# Patient Record
Sex: Female | Born: 1960 | Race: White | Hispanic: No | Smoking: Never smoker
Health system: Southern US, Community
[De-identification: ages and names within clinical notes are randomized; demographics above are authoritative.]

## PROBLEM LIST (undated history)

## (undated) DIAGNOSIS — A6920 Lyme disease, unspecified: Secondary | ICD-10-CM

## (undated) DIAGNOSIS — M797 Fibromyalgia: Secondary | ICD-10-CM

## (undated) DIAGNOSIS — K828 Other specified diseases of gallbladder: Secondary | ICD-10-CM

## (undated) HISTORY — PX: OVARIAN CYST SURGERY: SHX726

## (undated) HISTORY — PX: ECTOPIC PREGNANCY SURGERY: SHX613

---

## 2011-07-11 ENCOUNTER — Emergency Department: Payer: Self-pay | Admitting: *Deleted

## 2016-07-18 DIAGNOSIS — M7918 Myalgia, other site: Secondary | ICD-10-CM | POA: Insufficient documentation

## 2016-07-18 DIAGNOSIS — M542 Cervicalgia: Secondary | ICD-10-CM | POA: Insufficient documentation

## 2017-01-30 ENCOUNTER — Ambulatory Visit
Admission: EM | Admit: 2017-01-30 | Discharge: 2017-01-30 | Disposition: A | Discharge status: Home / Self Care | Payer: BLUE CROSS/BLUE SHIELD | Attending: Family Medicine | Admitting: Family Medicine

## 2017-01-30 ENCOUNTER — Encounter: Payer: Self-pay | Admitting: *Deleted

## 2017-01-30 DIAGNOSIS — S63501A Unspecified sprain of right wrist, initial encounter: Secondary | ICD-10-CM | POA: Diagnosis not present

## 2017-01-30 DIAGNOSIS — M79601 Pain in right arm: Secondary | ICD-10-CM

## 2017-01-30 DIAGNOSIS — M25531 Pain in right wrist: Secondary | ICD-10-CM | POA: Diagnosis not present

## 2017-01-30 HISTORY — DX: Fibromyalgia: M79.7

## 2017-01-30 HISTORY — DX: Lyme disease, unspecified: A69.20

## 2017-01-30 MED ORDER — PREDNISONE 20 MG PO TABS: ORAL_TABLET | ORAL | 0 refills | Status: DC

## 2017-01-30 NOTE — ED Provider Notes (Signed)
[facility]    CSN: [id number] Arrival date & time: 01/30/17  1309     History   Chief Complaint Chief Complaint  Patient presents with  . Arm Pain  . Wrist Pain    HPI Belinda Hawkins is a 56 y.o. female.   56 yo female with approximately 1 week h/o right wrist pain. 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    The history is provided by the patient.  Arm Pain   Wrist Pain     Past Medical History:  Diagnosis Date  . Fibromyalgia   . Lyme disease     There are no active problems to display for this patient.   Past Surgical History:  Procedure Laterality Date  . CESAREAN SECTION      OB History    No data available       Home Medications    Prior to Admission medications   Medication Sig Start Date End Date Taking? Authorizing Provider  buprenorphine (BUTRANS) 20 MCG/HR PTWK patch Place 20 mcg onto the skin once a week.   Yes Historical Provider, MD  oxyCODONE (OXYCONTIN) 15 mg 12 hr tablet Take 15 mg by mouth as needed.   Yes Historical Provider, MD  TIZANIDINE HCL PO Take by mouth daily.   Yes Historical Provider, MD  predniSONE (DELTASONE) 20 MG tablet 2 tabs po qd for 5 days 01/30/17   Payton Mccallum, MD    Family History History reviewed. No pertinent family history.  Social History Social History  Substance Use Topics  . Smoking status: Never Smoker  . Smokeless tobacco: Never Used  . Alcohol use No     Allergies   Nsaids   Review of Systems Review of Systems   Physical Exam Triage Vital Signs ED Triage Vitals  Enc Vitals Group     BP 01/30/17 1427 120/67     Pulse Rate 01/30/17 1427 82     Resp 01/30/17 1427 16     Temp 01/30/17 1427 98.3 F (36.8 C)     Temp Source 01/30/17 1427 Oral     SpO2 01/30/17 1427 100 %     Weight 01/30/17 1429 142 lb (64.4 kg)     Height 01/30/17 1429 5\' 2"  (1.575 m)     Head Circumference --      Peak Flow --      Pain Score  01/30/17 1434 5     Pain Loc --      Pain Edu? --      Excl. in GC? --    No data found.   Updated Vital Signs BP 120/67 (BP Location: Left Arm)   Pulse 82   Temp 98.3 F (36.8 C) (Oral)   Resp 16   Ht 5\' 2"  (1.575 m)   Wt 142 lb (64.4 kg)   SpO2 100%   BMI 25.97 kg/m   Visual Acuity Right Eye Distance:   Left Eye Distance:   Bilateral Distance:    Right Eye Near:   Left Eye Near:    Bilateral Near:     Physical Exam  Constitutional: She appears well-developed and well-nourished. No distress.  Musculoskeletal:       Right wrist: She exhibits tenderness (over the dorsal soft tissues). She exhibits normal range of motion, no bony tenderness, no swelling, no effusion, no crepitus, no deformity and no laceration.  Skin: She is not diaphoretic.  Nursing note and vitals reviewed.  UC Treatments / Results  Labs (all labs ordered are listed, but only abnormal results are displayed) Labs Reviewed - No data to display  EKG  EKG Interpretation None       Radiology No results found.  Procedures Procedures (including critical care time)  Medications Ordered in UC Medications - No data to display   Initial Impression / Assessment and Plan / UC Course  I have reviewed the triage vital signs and the nursing notes.  Pertinent labs & imaging results that were available during my care of the patient were reviewed by me and considered in my medical decision making (see chart for details).       Final Clinical Impressions(s) / UC Diagnoses   Final diagnoses:  Sprain of right wrist, initial encounter    New Prescriptions Discharge Medication List as of 01/30/2017  3:05 PM    START taking these medications   Details  predniSONE (DELTASONE) 20 MG tablet 2 tabs po qd for 5 days, Normal       1. diagnosis reviewed with patient 2. rx as per orders above; reviewed possible side effects, interactions, risks and benefits  3. Recommend supportive treatment with  rest, ice, otc analgesics 4. Follow-up prn if symptoms worsen or don't improve   Payton Mccallumrlando Lincoln Kleiner, MD 01/30/17 [phone]

## 2017-01-30 NOTE — ED Triage Notes (Signed)
Patient started having right wrist and arm pain 1 week ago.

## 2017-01-30 NOTE — Discharge Instructions (Signed)
Rest, ice

## 2017-02-11 ENCOUNTER — Ambulatory Visit
Admission: EM | Admit: 2017-02-11 | Discharge: 2017-02-11 | Disposition: A | Discharge status: Home / Self Care | Payer: BLUE CROSS/BLUE SHIELD | Attending: Family Medicine | Admitting: Family Medicine

## 2017-02-11 DIAGNOSIS — J204 Acute bronchitis due to parainfluenza virus: Secondary | ICD-10-CM

## 2017-02-11 MED ORDER — HYDROCOD POLST-CPM POLST ER 10-8 MG/5ML PO SUER: 5.0000 mL | Freq: Two times a day (BID) | ORAL | 0 refills | Status: DC | PRN

## 2017-02-11 MED ORDER — AZITHROMYCIN 250 MG PO TABS: ORAL_TABLET | ORAL | 0 refills | Status: DC

## 2017-02-11 NOTE — ED Provider Notes (Signed)
[facility]    CSN: [id number] Arrival date & time: 02/11/17  1656     History   Chief Complaint Chief Complaint  Patient presents with  . Cough    HPI Belinda Hawkins is a 56 y.o. female.   Patient is a 56 year old white female who's had a cough and congestion. She states that she had the flu last Wednesday. At that time she was doing some coughing but has sore throat aching all over fever and other symptoms of flu. As of the symptoms of the flu have improved by Friday the cough started getting worse and has progressed. Swing back to work today and is coughing as she states her head off difficult to work with coughing and chest congestion. Longer having a fever. She has informed me that she needs also a note for work saying that she was seen by Dr. Andrey Cota that she was too sick to come in last week when she had the flu because she called and there was a two hour wait to be seen. She denies smoking. She's had a C-section. She has a history of fibromyalgia and Lyme disease. Ceftin past family medical history she is allergic to NSAIDs as well.   The history is provided by the patient. No language interpreter was used.  Cough  Cough characteristics:  Productive and non-productive Sputum characteristics:  Yellow, white and green Severity:  Moderate Timing:  Constant Progression:  Worsening Chronicity:  New Smoker: no   Context: upper respiratory infection   Relieved by:  Nothing Worsened by:  Nothing Associated symptoms: shortness of breath and wheezing   Risk factors: recent infection     Past Medical History:  Diagnosis Date  . Fibromyalgia   . Lyme disease     There are no active problems to display for this patient.   Past Surgical History:  Procedure Laterality Date  . CESAREAN SECTION      OB History    No data available       Home Medications    Prior to Admission medications   Medication Sig Start Date End Date Taking? Authorizing  Provider  azithromycin (ZITHROMAX Z-PAK) 250 MG tablet Take 2 tablets first day and then 1 po a day for 4 days 02/11/17   Hassan Rowan, MD  buprenorphine (BUTRANS) 20 MCG/HR PTWK patch Place 20 mcg onto the skin once a week.    Historical Provider, MD  chlorpheniramine-HYDROcodone (TUSSIONEX PENNKINETIC ER) 10-8 MG/5ML SUER Take 5 mLs by mouth every 12 (twelve) hours as needed. 02/11/17   Hassan Rowan, MD  oxyCODONE (OXYCONTIN) 15 mg 12 hr tablet Take 15 mg by mouth as needed.    Historical Provider, MD  predniSONE (DELTASONE) 20 MG tablet 2 tabs po qd for 5 days 01/30/17   Payton Mccallum, MD  TIZANIDINE HCL PO Take by mouth daily.    Historical Provider, MD    Family History History reviewed. No pertinent family history.  Social History Social History  Substance Use Topics  . Smoking status: Never Smoker  . Smokeless tobacco: Never Used  . Alcohol use No     Allergies   Nsaids   Review of Systems Review of Systems  HENT: Positive for congestion.   Respiratory: Positive for cough, shortness of breath and wheezing.   All other systems reviewed and are negative.    Physical Exam Triage Vital Signs ED Triage Vitals  Enc Vitals Group     BP 02/11/17  1806 (!) 114/92     Pulse Rate 02/11/17 1806 71     Resp 02/11/17 1806 18     Temp 02/11/17 1806 98.2 F (36.8 C)     Temp Source 02/11/17 1806 Oral     SpO2 02/11/17 1806 100 %     Weight 02/11/17 1807 142 lb (64.4 kg)     Height 02/11/17 1807  (1.575 m)     Head Circumference --      Peak Flow --      Pain Score 02/11/17 1807 0     Pain Loc --      Pain Edu? --      Excl. in GC? --    No data found.   Updated Vital Signs BP (!) 114/92 (BP Location: Left Arm)   Pulse 71   Temp 98.2 F (36.8 C) (Oral)   Resp 18   Ht  (1.575 m)   Wt 142 lb (64.4 kg)   SpO2 100%   BMI 25.97 kg/m   Visual Acuity Right Eye Distance:   Left Eye Distance:   Bilateral Distance:    Right Eye Near:   Left Eye Near:      Bilateral Near:     Physical Exam  Constitutional: She is oriented to person, place, and time. She appears well-developed and well-nourished.  HENT:  Head: Normocephalic.  Right Ear: External ear normal.  Left Ear: External ear normal.  Mouth/Throat: Oropharynx is clear and moist.  Eyes: Conjunctivae and EOM are normal. Pupils are equal, round, and reactive to light.  Neck: Normal range of motion.  Cardiovascular: Normal rate, regular rhythm and normal heart sounds.   Pulmonary/Chest: Effort normal. She has rhonchi.  Patient is actively coughing during examination.  Musculoskeletal: Normal range of motion. She exhibits no edema or deformity.  Neurological: She is alert and oriented to person, place, and time. No cranial nerve deficit.  Skin: Skin is warm.  Psychiatric: She has a normal mood and affect.  Vitals reviewed.    UC Treatments / Results  Labs (all labs ordered are listed, but only abnormal results are displayed) Labs Reviewed - No data to display  EKG  EKG Interpretation None       Radiology No results found.  Procedures Procedures (including critical care time)  Medications Ordered in UC Medications - No data to display   Initial Impression / Assessment and Plan / UC Course  I have reviewed the triage vital signs and the nursing notes.  Pertinent labs & imaging results that were available during my care of the patient were reviewed by me and considered in my medical decision making (see chart for details).   will place patient on Tussionex 1 teaspoon twice a day Zithromax Z-PAK gave patient a work note saying she was seen today. She wants return to work tomorrow will put the date for tomorrow return. Follow-up PCP if not better in 1-2 weeks.   Final Clinical Impressions(s) / UC Diagnoses   Final diagnoses:  Acute bronchitis due to parainfluenza virus    New Prescriptions New Prescriptions   AZITHROMYCIN (ZITHROMAX Z-PAK) 250 MG TABLET    Take 2  tablets first day and then 1 po a day for 4 days   CHLORPHENIRAMINE-HYDROCODONE (TUSSIONEX PENNKINETIC ER) 10-8 MG/5ML SUER    Take 5 mLs by mouth every 12 (twelve) hours as needed.     Note: This dictation was prepared with Dragon dictation along with smaller phrase technology. Any transcriptional  errors that result from this process are unintentional.   Hassan Rowan, MD 02/11/17 [id number]

## 2017-02-11 NOTE — ED Triage Notes (Signed)
Pt c/o a cough that she has had since getting over the flu since last Wednesday. She says she needs a doctors note and something for a cough.

## 2020-01-23 ENCOUNTER — Encounter: Payer: Self-pay | Admitting: Emergency Medicine

## 2020-01-23 ENCOUNTER — Ambulatory Visit
Admission: EM | Admit: 2020-01-23 | Discharge: 2020-01-23 | Disposition: A | Discharge status: Home / Self Care | Payer: BC Managed Care – PPO | Attending: Family Medicine | Admitting: Family Medicine

## 2020-01-23 ENCOUNTER — Other Ambulatory Visit: Payer: Self-pay

## 2020-01-23 DIAGNOSIS — S50812A Abrasion of left forearm, initial encounter: Secondary | ICD-10-CM

## 2020-01-23 DIAGNOSIS — R6889 Other general symptoms and signs: Secondary | ICD-10-CM | POA: Diagnosis not present

## 2020-01-23 MED ORDER — AMOXICILLIN-POT CLAVULANATE 875-125 MG PO TABS: 1.0000 | ORAL_TABLET | Freq: Two times a day (BID) | ORAL | 0 refills | Status: AC

## 2020-01-23 MED ORDER — FLUOCINOLONE ACETONIDE 0.01 % EX CREA: TOPICAL_CREAM | Freq: Two times a day (BID) | CUTANEOUS | 0 refills | Status: DC | PRN

## 2020-01-23 MED ORDER — FLUCONAZOLE 150 MG PO TABS: ORAL_TABLET | ORAL | 0 refills | Status: DC

## 2020-01-23 NOTE — Discharge Instructions (Addendum)
Recommend start Augmentin 875mg  twice a day as directed. May take Diflucan 150mg  one tablet 2 days after starting antibiotic, may repeat another tablet at end of antibiotic use. Continue to clean area with soap and water. Apply topical antibiotic ointment as needed and cover with bandage. May use Fluocinolone cream twice a day around area to help with itching. Follow-up in 3 to 4 days if not improving.

## 2020-01-23 NOTE — ED Triage Notes (Signed)
Patient states that she scraped her arm on something this morning.  Patient states that her left forearm had started itching prior to the injury and states that she has been scratching at it.  Patient also states that she has noticed some ulcers or blisters at the site.  Patient denies fevers. Patient denies any drainage.

## 2020-01-24 NOTE — ED Provider Notes (Signed)
[facility]    CSN: [id number] Arrival date & time: 01/23/20  1410      History   Chief Complaint Chief Complaint  Patient presents with   Arm Pain    left forearm    HPI Belinda Hawkins is a 59 y.o. female.   59 year old female presents with irritation, scratches and small abrasion to her left forearm. She first felt some itching of her left lower arm about 2 to 3 days ago. Yesterday she noticed more scratches on her arm and is uncertain if her cats also scratched her in that area. This morning she scraped her arm against something this morning and noticed more ulcer-like abrasions that have been bleeding and oozing. Slightly tender but denies any fever, radiation of pain, numbness or warmth. She put antibiotic ointment on the areas and covered with bandage. She is concerned about MRSA infection. Other chronic health issues include chronic pain/fibromyalgia and currently on Oxycontin, Butrans patch and Tizanidine at night to sleep.   The history is provided by the patient.    Past Medical History:  Diagnosis Date   Fibromyalgia    Lyme disease     There are no problems to display for this patient.   Past Surgical History:  Procedure Laterality Date   CESAREAN SECTION      OB History   No obstetric history on file.      Home Medications    Prior to Admission medications   Medication Sig Start Date End Date Taking? Authorizing Provider  buprenorphine (BUTRANS) 20 MCG/HR PTWK patch Place 20 mcg onto the skin once a week.   Yes [provider]  oxyCODONE (OXYCONTIN) 15 mg 12 hr tablet Take 15 mg by mouth as needed.   Yes [provider]  TIZANIDINE HCL PO Take by mouth daily.   Yes [provider]  amoxicillin-clavulanate (AUGMENTIN) 875-125 MG tablet Take 1 tablet by mouth every 12 (twelve) hours for 7 days. 01/23/20 01/30/20  Sudie Grumbling, NP  fluconazole (DIFLUCAN) 150 MG tablet Take 1 tablet by mouth if symptoms of  antibiotic induced yeast infection occur. May repeat 1 tablet in 3 days if needed. 01/23/20   Sudie Grumbling, NP  fluocinolone (VANOS) 0.01 % cream Apply topically 2 (two) times daily as needed. To affected areas. 01/23/20   AmyotAli Lowe, NP    Family History Family History  Problem Relation Age of Onset   Healthy Mother    Melanoma Father     Social History Social History   Tobacco Use   Smoking status: Never Smoker   Smokeless tobacco: Never Used  Substance Use Topics   Alcohol use: No   Drug use: No     Allergies   Nsaids   Review of Systems Review of Systems  Constitutional: Negative for activity change, appetite change, chills, fatigue and fever.  Respiratory: Negative for chest tightness and shortness of breath.   Musculoskeletal: Positive for arthralgias and myalgias.  Skin: Positive for rash and wound. Negative for color change.  Allergic/Immunologic: Negative for environmental allergies and immunocompromised state.  Neurological: Negative for dizziness, seizures, syncope, weakness, light-headedness, numbness and headaches.  Hematological: Negative for adenopathy. Does not bruise/bleed easily.  Psychiatric/Behavioral: Positive for sleep disturbance.     Physical Exam Triage Vital Signs ED Triage Vitals  Enc Vitals Group     BP 01/23/20 1429 (!) 147/89     Pulse Rate 01/23/20 1429 77     Resp 01/23/20  1429 14     Temp 01/23/20 1429 98.5 F (36.9 C)     Temp Source 01/23/20 1429 Oral     SpO2 01/23/20 1429 97 %     Weight 01/23/20 1427 138 lb (62.6 kg)     Height 01/23/20 1427 5\' 2"  (1.575 m)     Head Circumference --      Peak Flow --      Pain Score 01/23/20 1427 2     Pain Loc --      Pain Edu? --      Excl. in [place]? --    No data found.  Updated Vital Signs BP (!) 147/89 (BP Location: Right Arm)    Pulse 77    Temp 98.5 F (36.9 C) (Oral)    Resp 14    Ht 5\' 2"  (1.575 m)    Wt 138 lb (62.6 kg)    SpO2 97%    BMI 25.24 kg/m   Visual  Acuity Right Eye Distance:   Left Eye Distance:   Bilateral Distance:    Right Eye Near:   Left Eye Near:    Bilateral Near:     Physical Exam Vitals and nursing note reviewed.  Constitutional:      General: She is awake. She is not in acute distress.    Appearance: She is well-developed and well-groomed.     Comments: She is sitting comfortably in exam chair in no acute distress but appears nervous.   HENT:     Head: Normocephalic and atraumatic.  Eyes:     Extraocular Movements: Extraocular movements intact.     Conjunctiva/sclera: Conjunctivae normal.  Cardiovascular:     Rate and Rhythm: Normal rate.  Pulmonary:     Effort: Pulmonary effort is normal.  Musculoskeletal:        General: Normal range of motion.     Right forearm: Normal.     Left forearm: Tenderness (slight) present. No swelling or deformity.       Arms:     Comments: 2-3 areas of straight line raised excoriations on her left outer forearm, closer to her elbow. 2 pencil eraser-sized thin abrasions present on lateral aspect of excoriations. Slightly oozing clear to bloody fluid. Slightly tender over area. No surrounding erythema. No signs of cellulitis. A few more straight-line lesions present on left upper outer arm. No numbness. Good distal pulses and capillary refill.   Skin:    General: Skin is warm.     Capillary Refill: Capillary refill takes less than 2 seconds.     Findings: Abrasion, rash and wound present. No bruising, ecchymosis or petechiae. Rash is papular.  Neurological:     General: No focal deficit present.     Mental Status: She is alert and oriented to person, place, and time.     Sensory: Sensation is intact. No sensory deficit.     Motor: Motor function is intact.  Psychiatric:        Attention and Perception: Attention normal.        Mood and Affect: Affect normal. Mood is anxious.        Speech: Speech normal.        Behavior: Behavior normal. Behavior is cooperative.        Thought  Content: Thought content normal.        Judgment: Judgment normal.      UC Treatments / Results  Labs (all labs ordered are listed, but only abnormal results are displayed) Labs  Reviewed - No data to display  EKG   Radiology No results found.  Procedures Procedures (including critical care time)  Medications Ordered in UC Medications - No data to display  Initial Impression / Assessment and Plan / UC Course  I have reviewed the triage vital signs and the nursing notes.  Pertinent labs & imaging results that were available during my care of the patient were reviewed by me and considered in my medical decision making (see chart for details).     Patient appeared nervous about wounds and rash and concern over MRSA. Discussed that she has mild excoriations/scratches with no indication of cellulitis at this time. Discussed that MRSA usually appears more like a boil with purulent discharge which is not her clinical presentation. Cleaned area with wound cleanser, applied Bacitracin ointment and covered with gauze and coban. Reviewed that since she is uncertain about whether a cat has scratched her, will treat with antibiotics that cover animal, particularly cat, scratches and potential infections. Start Augmentin 875mg  twice a day as directed. Since patient has difficulty with oral antibiotics with GI upset and change in normal flora, will prescribed Diflucan 150mg  to take on day 2 of antibiotic use. May repeat another tablet at end of antibiotics. Continue to clean area with soap and water and try to avoid additional scratching if possible. Apply topical antibiotic ointment as needed and cover with a bandage. May also use Fluocinolone cream twice a day on itchy areas of skin on upper and lower arms but avoid any open areas. Follow-up in 3 to 4 days if not improving.   Final Clinical Impressions(s) / UC Diagnoses   Final diagnoses:  Excoriation of forearm, left, initial encounter    Suspected cat scratch     Discharge Instructions     Recommend start Augmentin 875mg  twice a day as directed. May take Diflucan 150mg  one tablet 2 days after starting antibiotic, may repeat another tablet at end of antibiotic use. Continue to clean area with soap and water. Apply topical antibiotic ointment as needed and cover with bandage. May use Fluocinolone cream twice a day around area to help with itching. Follow-up in 3 to 4 days if not improving.     ED Prescriptions    Medication Sig Dispense Auth. Provider   amoxicillin-clavulanate (AUGMENTIN) 875-125 MG tablet Take 1 tablet by mouth every 12 (twelve) hours for 7 days. 14 tablet , NP   fluconazole (DIFLUCAN) 150 MG tablet Take 1 tablet by mouth if symptoms of antibiotic induced yeast infection occur. May repeat 1 tablet in 3 days if needed. 2 tablet , NP   fluocinolone (VANOS) 0.01 % cream Apply topically 2 (two) times daily as needed. To affected areas. 15 g , NP     PDMP not reviewed this encounter.   , NP 01/24/20 1127

## 2020-03-08 ENCOUNTER — Encounter: Payer: Self-pay | Admitting: Emergency Medicine

## 2020-03-08 ENCOUNTER — Other Ambulatory Visit: Payer: Self-pay

## 2020-03-08 ENCOUNTER — Ambulatory Visit
Admission: EM | Admit: 2020-03-08 | Discharge: 2020-03-08 | Disposition: A | Discharge status: Home / Self Care | Payer: BC Managed Care – PPO | Attending: Emergency Medicine | Admitting: Emergency Medicine

## 2020-03-08 DIAGNOSIS — L03114 Cellulitis of left upper limb: Secondary | ICD-10-CM

## 2020-03-08 DIAGNOSIS — T63481A Toxic effect of venom of other arthropod, accidental (unintentional), initial encounter: Secondary | ICD-10-CM

## 2020-03-08 MED ORDER — PREDNISONE 10 MG PO TABS: ORAL_TABLET | ORAL | 0 refills | Status: DC

## 2020-03-08 MED ORDER — SULFAMETHOXAZOLE-TRIMETHOPRIM 800-160 MG PO TABS: 1.0000 | ORAL_TABLET | Freq: Two times a day (BID) | ORAL | 0 refills | Status: AC

## 2020-03-08 NOTE — Discharge Instructions (Addendum)
Take medication as prescribed. Rest. Drink plenty of fluids. Elevate. Ice. Over the counter benadryl and pepcid as discussed.   Follow up with your primary care physician this week as needed. Return to Urgent care for new or worsening concerns.

## 2020-03-08 NOTE — ED Triage Notes (Signed)
Patient states she was stung by a bee on her left arm 2 days ago. She has swelling, redness and the area is warm to touch.

## 2020-03-08 NOTE — ED Provider Notes (Signed)
[facility] ____________________________________________  Time seen: Approximately 8:37 PM  I have reviewed the triage vital signs and the nursing notes.   HISTORY  Chief Complaint Insect Bite and Arm Swelling   HPI Belinda Hawkins is a 59 y.o. female presenting for evaluation of left arm redness after bee sting that occurred 2 days ago.  Reports initially she had no pain and minimal redness at the site of bee sting, but reports since it has progressively increase in redness, swelling.  States the area is somewhat tender as well as itchy.  Denies decreased range of motion.  Denies fevers, shortness of breath, difficulty breathing, throat or oral swelling.  Denies history of allergic reaction to bee stings in past.  States she does not receive tetanus immunizations and declines tetanus immunization.  Denies fevers.  Reports otherwise doing well.    Past Medical History:  Diagnosis Date  . Fibromyalgia   . Lyme disease     There are no problems to display for this patient.   Past Surgical History:  Procedure Laterality Date  . CESAREAN SECTION       No current facility-administered medications for this encounter.  Current Outpatient Medications:  .  buprenorphine (BUTRANS) 20 MCG/HR PTWK patch, Place 20 mcg onto the skin once a week., Disp: , Rfl:  .  oxyCODONE (OXYCONTIN) 15 mg 12 hr tablet, Take 15 mg by mouth as needed., Disp: , Rfl:  .  TIZANIDINE HCL PO, Take by mouth daily., Disp: , Rfl:  .  fluconazole (DIFLUCAN) 150 MG tablet, Take 1 tablet by mouth if symptoms of antibiotic induced yeast infection occur. May repeat 1 tablet in 3 days if needed., Disp: 2 tablet, Rfl: 0 .  fluocinolone (VANOS) 0.01 % cream, Apply topically 2 (two) times daily as needed. To affected areas., Disp: 15 g, Rfl: 0 .  predniSONE (DELTASONE) 10 MG tablet, Start 60 mg po day one, then 50 mg po day two, taper by 10 mg daily until complete., Disp: 21 tablet, Rfl: 0 .   sulfamethoxazole-trimethoprim (BACTRIM DS) 800-160 MG tablet, Take 1 tablet by mouth 2 (two) times daily for 7 days., Disp: 14 tablet, Rfl: 0  Allergies Nsaids  Family History  Problem Relation Age of Onset  . Healthy Mother   . Melanoma Father     Social History Social History   Tobacco Use  . Smoking status: Never Smoker  . Smokeless tobacco: Never Used  Substance Use Topics  . Alcohol use: No  . Drug use: No    Review of Systems Constitutional: No fever ENT: Denies difficulty swallowing. Cardiovascular: Denies chest pain. Respiratory: Denies shortness of breath. Gastrointestinal: No abdominal pain. Musculoskeletal: Positive left arm discomfort. Skin: Positive rash.  ____________________________________________   PHYSICAL EXAM:  VITAL SIGNS: ED Triage Vitals  Enc Vitals Group     BP 03/08/20 1934 122/87     Pulse Rate 03/08/20 1934 80     Resp 03/08/20 1934 18     Temp 03/08/20 1934 98.2 F (36.8 C)     Temp Source 03/08/20 1934 Oral     SpO2 03/08/20 1934 99 %     Weight 03/08/20 1932 142 lb (64.4 kg)     Height 03/08/20 1932 5\' 2"  (1.575 m)     Head Circumference --      Peak Flow --      Pain Score 03/08/20 1932 4     Pain Loc --      Pain Edu? --  Excl. in GC? --     Constitutional: Alert and oriented. Well appearing and in no acute distress. Eyes: Conjunctivae are normal.  ENT      Head: Normocephalic and atraumatic.. Cardiovascular: Normal rate, regular rhythm. Grossly normal heart sounds.  Good peripheral circulation. Respiratory: Normal respiratory effort without tachypnea nor retractions. Breath sounds are clear and equal bilaterally. No wheezes, rales, rhonchi. Musculoskeletal: Steady gait. Neurologic:  Normal speech and language. No gross focal neurologic deficits are appreciated. Speech is normal. No gait instability.  Skin:  Skin is warm, dry except: Left medial arm 12 x 16 cm noncircumferential erythema with mild warmth and minimal  tenderness, no drainage, no induration, no fluctuance, full range of motion present, normal distal sensation and cap refill, normal distal radial pulse. Psychiatric: Mood and affect are normal. Speech and behavior are normal. Patient exhibits appropriate insight and judgment   ___________________________________________   LABS (all labs ordered are listed, but only abnormal results are displayed)  Labs Reviewed - No data to display ____________________________________________   PROCEDURES Procedures     INITIAL IMPRESSION / ASSESSMENT AND PLAN / ED COURSE  Pertinent labs & imaging results that were available during my care of the patient were reviewed by me and considered in my medical decision making (see chart for details).  Well-appearing patient.  Left arm local reaction from bee sting, concern for secondary cellulitis.  Will treat with prednisone and Bactrim, over-the-counter Benadryl and Pepcid.  Elevation, ice, rest and supportive care.  Discussed indication, risks and benefits of medications with patient. Discussed follow up and return parameters including no resolution or any worsening concerns. Patient verbalized understanding and agreed to plan.   ____________________________________________   FINAL CLINICAL IMPRESSION(S) / ED DIAGNOSES  Final diagnoses:  Local reaction to insect sting, accidental or unintentional, initial encounter  Left arm cellulitis     ED Discharge Orders         Ordered    predniSONE (DELTASONE) 10 MG tablet     03/08/20 2000    sulfamethoxazole-trimethoprim (BACTRIM DS) 800-160 MG tablet  2 times daily     03/08/20 2000           Note: This dictation was prepared with Dragon dictation along with smaller phrase technology. Any transcriptional errors that result from this process are unintentional.         Renford Dills, NP 03/08/20 2040

## 2020-04-28 ENCOUNTER — Ambulatory Visit
Admission: EM | Admit: 2020-04-28 | Discharge: 2020-04-28 | Disposition: A | Discharge status: Home / Self Care | Payer: BC Managed Care – PPO | Attending: Emergency Medicine | Admitting: Emergency Medicine

## 2020-04-28 ENCOUNTER — Encounter: Payer: Self-pay | Admitting: Emergency Medicine

## 2020-04-28 ENCOUNTER — Other Ambulatory Visit: Payer: Self-pay

## 2020-04-28 DIAGNOSIS — N390 Urinary tract infection, site not specified: Secondary | ICD-10-CM

## 2020-04-28 DIAGNOSIS — R200 Anesthesia of skin: Secondary | ICD-10-CM | POA: Diagnosis present

## 2020-04-28 DIAGNOSIS — R319 Hematuria, unspecified: Secondary | ICD-10-CM | POA: Diagnosis present

## 2020-04-28 LAB — URINALYSIS, COMPLETE (UACMP) WITH MICROSCOPIC
Bilirubin Urine: NEGATIVE
Glucose, UA: NEGATIVE mg/dL
Ketones, ur: NEGATIVE mg/dL
Nitrite: NEGATIVE
Protein, ur: NEGATIVE mg/dL
Specific Gravity, Urine: 1.02 (ref 1.005–1.030)
WBC, UA: >50 WBC/hpf (ref 0–5)
pH: 7 (ref 5.0–8.0)

## 2020-04-28 MED ORDER — CEPHALEXIN 500 MG PO CAPS: 500.0000 mg | ORAL_CAPSULE | Freq: Two times a day (BID) | ORAL | 0 refills | Status: AC

## 2020-04-28 MED ORDER — FLUCONAZOLE 150 MG PO TABS: 150.0000 mg | ORAL_TABLET | Freq: Every day | ORAL | 0 refills | Status: DC

## 2020-04-28 NOTE — ED Provider Notes (Addendum)
[facility] ____________________________________________  Time seen: Approximately 8:26 PM  I have reviewed the triage vital signs and the nursing notes.   HISTORY  Chief Complaint Dysuria   HPI Belinda Hawkins is a 59 y.o. female presenting for evaluation of dysuria x1 week.  Patient reports gradual onset.  States initially she noticed some urinary discomfort, then urinary frequency and today noticed odor to urine.  Some suprapubic pressure.  Low back pain present.  Denies flank pain.  Has continued to eat and drink well.  Denies fevers.  Recently in [place] and just traveled back.  States similar to previous UTI, most recent was in March.  Denies other aggravating or alleviating factors.    Past Medical History:  Diagnosis Date  . Fibromyalgia   . Lyme disease     There are no problems to display for this patient.   Past Surgical History:  Procedure Laterality Date  . CESAREAN SECTION       No current facility-administered medications for this encounter.  Current Outpatient Medications:  .  buprenorphine (BUTRANS) 20 MCG/HR PTWK patch, Place 20 mcg onto the skin once a week., Disp: , Rfl:  .  oxyCODONE (OXYCONTIN) 15 mg 12 hr tablet, Take 15 mg by mouth as needed., Disp: , Rfl:  .  TIZANIDINE HCL PO, Take by mouth daily., Disp: , Rfl:  .  cephALEXin (KEFLEX) 500 MG capsule, Take 1 capsule (500 mg total) by mouth 2 (two) times daily for 7 days., Disp: 14 capsule, Rfl: 0 .  fluconazole (DIFLUCAN) 150 MG tablet, Take 1 tablet (150 mg total) by mouth daily. Take one pill orally once as needed, Disp: 1 tablet, Rfl: 0 .  fluocinolone (VANOS) 0.01 % cream, Apply topically 2 (two) times daily as needed. To affected areas., Disp: 15 g, Rfl: 0  Allergies Nsaids  Family History  Problem Relation Age of Onset  . Healthy Mother   . Melanoma Father     Social History Social History   Tobacco Use  . Smoking status: Never Smoker  . Smokeless tobacco: Never  Used  Vaping Use  . Vaping Use: Never used  Substance Use Topics  . Alcohol use: No  . Drug use: No    Review of Systems Constitutional: No fever Cardiovascular: Denies chest pain. Respiratory: Denies shortness of breath. Gastrointestinal: No abdominal pain.  No nausea, no vomiting.  No diarrhea.  Genitourinary: Positive for dysuria. Musculoskeletal: Positive low back pain. Skin: Negative for rash.   ____________________________________________   PHYSICAL EXAM:  VITAL SIGNS: ED Triage Vitals  Enc Vitals Group     BP 04/28/20 1910 115/71     Pulse Rate 04/28/20 1910 90     Resp 04/28/20 1910 18     Temp 04/28/20 1910 98.2 F (36.8 C)     Temp Source 04/28/20 1910 Oral     SpO2 04/28/20 1910 98 %     Weight 04/28/20 1909 142 lb (64.4 kg)     Height 04/28/20 1909 5\' 2"  (1.575 m)     Head Circumference --      Peak Flow --      Pain Score 04/28/20 1908 4     Pain Loc --      Pain Edu? --      Excl. in GC? --     Constitutional: Alert and oriented. Well appearing and in no acute distress. Eyes: Conjunctivae are normal. PERRL.  ENT      Head: Normocephalic and atraumatic. Cardiovascular: Normal  rate, regular rhythm. Grossly normal heart sounds.  Good peripheral circulation. Respiratory: Normal respiratory effort without tachypnea nor retractions. Breath sounds are clear and equal bilaterally. No wheezes, rales, rhonchi. Gastrointestinal: Mild midline suprapubic pressure.  Abdomen otherwise soft nontender.  No CVA tenderness. Musculoskeletal: Steady gait. Neurologic:  Normal speech and language. No gross focal neurologic deficits are appreciated. Speech is normal. No gait instability.  Skin:  Skin is warm, dry and intact. No rash noted. Psychiatric: Mood and affect are normal. Speech and behavior are normal. Patient exhibits appropriate insight and judgment   ___________________________________________   LABS (all labs ordered are listed, but only abnormal results  are displayed)  Labs Reviewed  URINALYSIS, COMPLETE (UACMP) WITH MICROSCOPIC - Abnormal; Notable for the following components:      Result Value   APPearance HAZY (*)    Hgb urine dipstick TRACE (*)    Leukocytes,Ua MODERATE (*)    Bacteria, UA MANY (*)    All other components within normal limits  URINE CULTURE     PROCEDURES Procedures     INITIAL IMPRESSION / ASSESSMENT AND PLAN / ED COURSE  Pertinent labs & imaging results that were available during my care of the patient were reviewed by me and considered in my medical decision making (see chart for details).  Well-appearing patient.  No acute distress.  Dysuria complaints.  Urinalysis reviewed, UTI.  Will culture and empirically start on oral Keflex.  Diflucan prescription given as needed, per her request. Encouraged monitoring follow-up with primary care.  Of note, as ending patient encounter, patient reports since Monday she has been having right ear and right facial numbness.  Also reports that she has had intermittent confused thoughts and difficulty finding her words.  At this time recommended for patient to go directly to the emergency room.  Offered to call EMS and sent patient by EMS, however she declined.  Again strongly encourage patient to go directly to emergency room for further evaluation as concern for neurological event including CVA versus TIA.  Patient alert and oriented with decisional capacity and states that she will consider going to the ER. ____________________________________________   FINAL CLINICAL IMPRESSION(S) / ED DIAGNOSES  Final diagnoses:  Urinary tract infection with hematuria, site unspecified  Right facial numbness     ED Discharge Orders         Ordered    cephALEXin (KEFLEX) 500 MG capsule  2 times daily     Discontinue  Reprint     04/28/20 2027    fluconazole (DIFLUCAN) 150 MG tablet  Daily     Discontinue  Reprint     04/28/20 2027           Note: This dictation was  prepared with Dragon dictation along with smaller phrase technology. Any transcriptional errors that result from this process are unintentional.         Marylene Land, NP 04/28/20 2047

## 2020-04-28 NOTE — ED Triage Notes (Signed)
Patient c/o dysuria that started 1 week ago.

## 2020-04-28 NOTE — Discharge Instructions (Addendum)
Take medication as prescribed. Rest. Drink plenty of fluids.   Follow up with your primary care physician this week .  At this time however I recommend for you to directly to the emergency room for the numbness report.This is important.

## 2020-05-01 LAB — URINE CULTURE: Culture: 70000 — AB

## 2020-10-22 ENCOUNTER — Ambulatory Visit
Admission: EM | Admit: 2020-10-22 | Discharge: 2020-10-22 | Disposition: A | Discharge status: Home / Self Care | Payer: 59 | Attending: Physician Assistant | Admitting: Physician Assistant

## 2020-10-22 ENCOUNTER — Other Ambulatory Visit: Payer: Self-pay

## 2020-10-22 ENCOUNTER — Encounter: Payer: Self-pay | Admitting: Emergency Medicine

## 2020-10-22 DIAGNOSIS — Z79899 Other long term (current) drug therapy: Secondary | ICD-10-CM | POA: Diagnosis not present

## 2020-10-22 DIAGNOSIS — Z20822 Contact with and (suspected) exposure to covid-19: Secondary | ICD-10-CM | POA: Diagnosis not present

## 2020-10-22 DIAGNOSIS — J069 Acute upper respiratory infection, unspecified: Secondary | ICD-10-CM

## 2020-10-22 DIAGNOSIS — R059 Cough, unspecified: Secondary | ICD-10-CM | POA: Insufficient documentation

## 2020-10-22 DIAGNOSIS — J029 Acute pharyngitis, unspecified: Secondary | ICD-10-CM | POA: Insufficient documentation

## 2020-10-22 LAB — RESP PANEL BY RT-PCR (FLU A&B, COVID) ARPGX2
Influenza A by PCR: NEGATIVE
Influenza B by PCR: NEGATIVE
SARS Coronavirus 2 by RT PCR: NEGATIVE

## 2020-10-22 LAB — GROUP A STREP BY PCR: Group A Strep by PCR: NOT DETECTED

## 2020-10-22 NOTE — ED Provider Notes (Signed)
[facility]    CSN: [id number] Arrival date & time: 10/22/20  1456      History   Chief Complaint Chief Complaint  Patient presents with  . Sore Throat    HPI Belinda Hawkins is a 59 y.o. female presenting for 6-day history of sore throat, mouth lesions, cough, and congestion.  Patient also admits to swollen glands of her neck and occasional headaches.  She denies any fevers.  Patient states that she has some virus that she says since she was a child which causes her to have occasional mouth lesions.  Patient has been taking lysine and says that the mouth lesions have gotten better, but she still feels very fatigued and rundown.  Denies any known COVID-19 exposure and has not been vaccinated for COVID-19.  No other concerns.  HPI  Past Medical History:  Diagnosis Date  . Fibromyalgia   . Lyme disease     There are no problems to display for this patient.   Past Surgical History:  Procedure Laterality Date  . CESAREAN SECTION      OB History   No obstetric history on file.      Home Medications    Prior to Admission medications   Medication Sig Start Date End Date Taking? Authorizing Provider  buprenorphine (BUTRANS) 20 MCG/HR PTWK Place 20 mcg onto the skin once a week.   Yes [provider]  oxyCODONE (OXYCONTIN) 15 mg 12 hr tablet Take 15 mg by mouth as needed.   Yes [provider]  TIZANIDINE HCL PO Take by mouth daily.   Yes [provider]  traZODone (DESYREL) 50 MG tablet Take by mouth. 12/05/16  Yes [provider]  fluconazole (DIFLUCAN) 150 MG tablet Take 1 tablet (150 mg total) by mouth daily. Take one pill orally once as needed 04/28/20   Renford Dills, NP  fluocinolone (VANOS) 0.01 % cream Apply topically 2 (two) times daily as needed. To affected areas. 01/23/20   Sudie Grumbling, NP    Family History Family History  Problem Relation Age of Onset  . Healthy Mother   . Melanoma Father     Social  History Social History   Tobacco Use  . Smoking status: Never Smoker  . Smokeless tobacco: Never Used  Vaping Use  . Vaping Use: Never used  Substance Use Topics  . Alcohol use: No  . Drug use: No     Allergies   Nsaids   Review of Systems Review of Systems  Constitutional: Positive for fatigue. Negative for chills, diaphoresis and fever.  HENT: Positive for congestion, mouth sores and sore throat. Negative for ear pain, rhinorrhea, sinus pressure and sinus pain.   Respiratory: Positive for cough. Negative for shortness of breath.   Gastrointestinal: Negative for abdominal pain, nausea and vomiting.  Musculoskeletal: Negative for arthralgias and myalgias.  Skin: Negative for rash.  Neurological: Negative for weakness and headaches.  Hematological: Positive for adenopathy.     Physical Exam Triage Vital Signs ED Triage Vitals  Enc Vitals Group     BP 10/22/20 1531 125/82     Pulse Rate 10/22/20 1531 92     Resp 10/22/20 1531 14     Temp 10/22/20 1531 98.3 F (36.8 C)     Temp Source 10/22/20 1531 Oral     SpO2 10/22/20 1531 97 %     Weight 10/22/20 1529 142 lb (64.4 kg)     Height 10/22/20 1529 5\' 2"  (1.575 m)  Head Circumference --      Peak Flow --      Pain Score 10/22/20 1529 0     Pain Loc --      Pain Edu? --      Excl. in GC? --    No data found.  Updated Vital Signs BP 125/82 (BP Location: Left Arm)   Pulse 92   Temp 98.3 F (36.8 C) (Oral)   Resp 14   Ht 5\' 2"  (1.575 m)   Wt 142 lb (64.4 kg)   SpO2 97%   BMI 25.97 kg/m   Physical Exam Vitals and nursing note reviewed.  Constitutional:      General: She is not in acute distress.    Appearance: Normal appearance. She is not ill-appearing or toxic-appearing.  HENT:     Head: Normocephalic and atraumatic.     Nose: Congestion and rhinorrhea present.     Mouth/Throat:     Mouth: Mucous membranes are moist.     Pharynx: Oropharynx is clear. Posterior oropharyngeal erythema (few minor  scattered and small ulcers posterior pharynx) present.  Eyes:     General: No scleral icterus.       Right eye: No discharge.        Left eye: No discharge.     Conjunctiva/sclera: Conjunctivae normal.  Cardiovascular:     Rate and Rhythm: Normal rate and regular rhythm.     Heart sounds: Normal heart sounds.  Pulmonary:     Effort: Pulmonary effort is normal. No respiratory distress.     Breath sounds: Normal breath sounds.  Musculoskeletal:     Cervical back: Neck supple.  Skin:    General: Skin is dry.  Neurological:     General: No focal deficit present.     Mental Status: She is alert. Mental status is at baseline.     Motor: No weakness.     Gait: Gait normal.  Psychiatric:        Mood and Affect: Mood normal.        Behavior: Behavior normal.        Thought Content: Thought content normal.      UC Treatments / Results  Labs (all labs ordered are listed, but only abnormal results are displayed) Labs Reviewed  GROUP A STREP BY PCR  RESP PANEL BY RT-PCR (FLU A&B, COVID) [id number]    EKG   Radiology No results found.  Procedures Procedures (including critical care time)  Medications Ordered in UC Medications - No data to display  Initial Impression / Assessment and Plan / UC Course  I have reviewed the triage vital signs and the nursing notes.  Pertinent labs & imaging results that were available during my care of the patient were reviewed by me and considered in my medical decision making (see chart for details).   Molecular strep test and respiratory panel all negative.  Advised patient she likely has a viral infection.  Encourage supportive care with increasing rest and fluids.  Offered patient cough syrup or Viscous Lidocaine, but she declined at this time.  Advised her to follow-up as needed for any new or worsening symptoms or she is not better in the next week.  ED precautions reviewed.  Final Clinical Impressions(s) / UC Diagnoses   Final diagnoses:   Viral upper respiratory tract infection  Sore throat  Cough     Discharge Instructions     URI/COLD SYMPTOMS: Your exam today is consistent with a viral illness. Antibiotics are  not indicated at this time. Use medications as directed, including cough syrup, nasal saline, and decongestants. Your symptoms should improve over the next few days and resolve within 7-10 days. Increase rest and fluids. F/u if symptoms worsen or predominate such as sore throat, ear pain, productive cough, shortness of breath, or if you develop high fevers or worsening fatigue over the next several days.      ED Prescriptions    None     PDMP not reviewed this encounter.   Shirlee Latch, PA-C 10/23/20 0745

## 2020-10-22 NOTE — Discharge Instructions (Addendum)
URI/COLD SYMPTOMS: Your exam today is consistent with a viral illness. Antibiotics are not indicated at this time. Use medications as directed, including cough syrup, nasal saline, and decongestants. Your symptoms should improve over the next few days and resolve within 7-10 days. Increase rest and fluids. F/u if symptoms worsen or predominate such as sore throat, ear pain, productive cough, shortness of breath, or if you develop high fevers or worsening fatigue over the next several days.    

## 2020-10-22 NOTE — ED Triage Notes (Signed)
Patient c/o sore throat that started on Monday.  Patient also reports mouth sores and cough and congestion that started couple days ago.  Patient denies fevers.

## 2020-12-02 ENCOUNTER — Other Ambulatory Visit: Payer: Self-pay

## 2020-12-02 ENCOUNTER — Ambulatory Visit
Admission: EM | Admit: 2020-12-02 | Discharge: 2020-12-02 | Disposition: A | Discharge status: Home / Self Care | Payer: 59 | Attending: Family Medicine | Admitting: Family Medicine

## 2020-12-02 ENCOUNTER — Encounter: Payer: Self-pay | Admitting: Emergency Medicine

## 2020-12-02 DIAGNOSIS — R1011 Right upper quadrant pain: Secondary | ICD-10-CM | POA: Diagnosis not present

## 2020-12-02 DIAGNOSIS — K828 Other specified diseases of gallbladder: Secondary | ICD-10-CM | POA: Diagnosis not present

## 2020-12-02 HISTORY — DX: Other specified diseases of gallbladder: K82.8

## 2020-12-02 LAB — URINALYSIS, COMPLETE (UACMP) WITH MICROSCOPIC
Bacteria, UA: NONE SEEN
Bilirubin Urine: NEGATIVE
Glucose, UA: NEGATIVE mg/dL
Ketones, ur: NEGATIVE mg/dL
Leukocytes,Ua: NEGATIVE
Nitrite: NEGATIVE
Protein, ur: NEGATIVE mg/dL
Specific Gravity, Urine: 1.015 (ref 1.005–1.030)
Squamous Epithelial / LPF: NONE SEEN (ref 0–5)
WBC, UA: NONE SEEN WBC/hpf (ref 0–5)
pH: 8.5 — ABNORMAL HIGH (ref 5.0–8.0)

## 2020-12-02 MED ORDER — ONDANSETRON 8 MG PO TBDP
8.0000 mg | ORAL_TABLET | Freq: Once | ORAL | Status: AC
  Administered 2020-12-02: 8 mg via ORAL

## 2020-12-02 MED ORDER — ONDANSETRON 4 MG PO TBDP: 4.0000 mg | ORAL_TABLET | Freq: Three times a day (TID) | ORAL | 0 refills | Status: DC | PRN

## 2020-12-02 NOTE — ED Triage Notes (Signed)
Pt presents today with RUQ abd pain with n/v/d. 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 Pain 4/10. Vomited 4 in 24 hours. No diarrhea in 24 hours. Has taken Immodium prn.

## 2020-12-02 NOTE — ED Provider Notes (Signed)
[facility]    CSN: [id number] Arrival date & time: 12/02/20  1648  History   Chief Complaint Chief Complaint  Patient presents with  . Abdominal Pain    RUQ  . Emesis   HPI  60 year old female presents with the above complaints.  Patient reports that she has had some ongoing issues with right upper quadrant pain recently but it worsened today.  She reports right upper quadrant pain, nausea, vomiting, diarrhea.  She states that she has a history of biliary dyskinesia.  She has not had a cholecystectomy.  Pain 4/10 in severity.  She had several episodes of emesis.  She is taken Imodium without resolution.  No fever.  No other reported symptoms.  No other complaints  Past Medical History:  Diagnosis Date  . Biliary dyskinesia   . Fibromyalgia   . Lyme disease    Past Surgical History:  Procedure Laterality Date  . CESAREAN SECTION      OB History   No obstetric history on file.      Home Medications    Prior to Admission medications   Medication Sig Start Date End Date Taking? Authorizing Provider  buprenorphine (BUTRANS) 20 MCG/HR PTWK Place 20 mcg onto the skin once a week.   Yes [provider]  ondansetron (ZOFRAN ODT) 4 MG disintegrating tablet Take 1 tablet (4 mg total) by mouth every 8 (eight) hours as needed for nausea or vomiting. 12/02/20  Yes Everlene Other G, DO  oxyCODONE (OXYCONTIN) 15 mg 12 hr tablet Take 15 mg by mouth as needed.   Yes [provider]  TIZANIDINE HCL PO Take by mouth daily.   Yes [provider]  traZODone (DESYREL) 50 MG tablet Take by mouth. 12/05/16 12/02/20  [provider]    Family History Family History  Problem Relation Age of Onset  . Healthy Mother   . Melanoma Father     Social History Social History   Tobacco Use  . Smoking status: Never Smoker  . Smokeless tobacco: Never Used  Vaping Use  . Vaping Use: Never used  Substance Use Topics  . Alcohol use: No  . Drug use:  No     Allergies   Nsaids   Review of Systems Review of Systems  Constitutional: Negative for fever.  Gastrointestinal: Positive for abdominal pain, diarrhea, nausea and vomiting.   Physical Exam Triage Vital Signs ED Triage Vitals  Enc Vitals Group     BP 12/02/20 1803 (!) 159/92     Pulse Rate 12/02/20 1803 69     Resp 12/02/20 1803 16     Temp 12/02/20 1803 98.5 F (36.9 C)     Temp Source 12/02/20 1803 Oral     SpO2 12/02/20 1803 99 %     Weight --      Height --      Head Circumference --      Peak Flow --      Pain Score 12/02/20 1759 4     Pain Loc --      Pain Edu? --      Excl. in GC? --    Updated Vital Signs BP (!) 159/92 (BP Location: Left Arm)   Pulse 69   Temp 98.5 F (36.9 C) (Oral)   Resp 16   SpO2 99%   Visual Acuity Right Eye Distance:   Left Eye Distance:   Bilateral Distance:    Right Eye Near:   Left Eye Near:  Bilateral Near:     Physical Exam Vitals and nursing note reviewed.  Constitutional:      General: She is not in acute distress.    Appearance: Normal appearance. She is not ill-appearing.  HENT:     Head: Normocephalic and atraumatic.  Eyes:     General:        Right eye: No discharge.        Left eye: No discharge.     Conjunctiva/sclera: Conjunctivae normal.  Cardiovascular:     Rate and Rhythm: Normal rate and regular rhythm.     Heart sounds: No murmur heard.   Pulmonary:     Effort: Pulmonary effort is normal.     Breath sounds: Normal breath sounds. No wheezing, rhonchi or rales.  Abdominal:     General: There is no distension.     Palpations: Abdomen is soft.     Comments: Right upper quadrant tenderness to palpation.  Neurological:     Mental Status: She is alert.  Psychiatric:        Mood and Affect: Mood normal.        Behavior: Behavior normal.    UC Treatments / Results  Labs (all labs ordered are listed, but only abnormal results are displayed) Labs Reviewed  URINALYSIS, COMPLETE (UACMP)  WITH MICROSCOPIC - Abnormal; Notable for the following components:      Result Value   pH 8.5 (*)    Hgb urine dipstick TRACE (*)    All other components within normal limits    EKG   Radiology No results found.  Procedures Procedures (including critical care time)  Medications Ordered in UC Medications  ondansetron (ZOFRAN-ODT) disintegrating tablet 8 mg (8 mg Oral Given 12/02/20 1834)    Initial Impression / Assessment and Plan / UC Course  I have reviewed the triage vital signs and the nursing notes.  Pertinent labs & imaging results that were available during my care of the patient were reviewed by me and considered in my medical decision making (see chart for details).    60 year old female presents with right upper quadrant pain.  Patient has a history of biliary dyskinesia.  Zofran given in clinic today.  Discharging home on Zofran.  Advised to avoid fatty meals.  Referral placed to general surgery.  Final Clinical Impressions(s) / UC Diagnoses   Final diagnoses:  RUQ pain  Biliary dyskinesia   Discharge Instructions   None    ED Prescriptions    Medication Sig Dispense Auth. Provider   ondansetron (ZOFRAN ODT) 4 MG disintegrating tablet Take 1 tablet (4 mg total) by mouth every 8 (eight) hours as needed for nausea or vomiting. 20 tablet Tommie Sams, DO     PDMP not reviewed this encounter.   Tommie Sams, [place] 12/02/20 1955

## 2020-12-15 ENCOUNTER — Ambulatory Visit: Payer: Self-pay | Admitting: General Surgery

## 2020-12-22 ENCOUNTER — Ambulatory Visit: Payer: Self-pay | Admitting: General Surgery

## 2020-12-27 ENCOUNTER — Ambulatory Visit: Payer: Self-pay | Admitting: General Surgery

## 2021-01-03 ENCOUNTER — Encounter: Payer: Self-pay | Admitting: *Deleted

## 2021-01-12 ENCOUNTER — Encounter: Payer: Self-pay | Admitting: General Surgery

## 2021-01-12 ENCOUNTER — Other Ambulatory Visit: Payer: Self-pay

## 2021-01-12 ENCOUNTER — Ambulatory Visit (INDEPENDENT_AMBULATORY_CARE_PROVIDER_SITE_OTHER): Payer: 59 | Admitting: General Surgery

## 2021-01-12 VITALS — BP 123/85 | HR 88 | Temp 97.5°F | Ht 62.0 in | Wt 142.0 lb

## 2021-01-12 DIAGNOSIS — K589 Irritable bowel syndrome without diarrhea: Secondary | ICD-10-CM | POA: Diagnosis not present

## 2021-01-12 DIAGNOSIS — K828 Other specified diseases of gallbladder: Secondary | ICD-10-CM

## 2021-01-12 NOTE — Patient Instructions (Addendum)
A referral to GI has been placed. They will call you for an appointment. If you have any concerns or questions, please feel free to call our clinic. An ultrasound order has been placed. I will call you with your appointment.

## 2021-01-12 NOTE — Progress Notes (Signed)
Patient ID: Belinda Hawkins, female   DOB: [birth year], 60 y.o.   MRN: [id number]  Chief Complaint  Patient presents with  . New Patient (Initial Visit)    UC referral for gallbladder    HPI Belinda Hawkins is a 60 y.o. female.   She is here today as a referral from the [facility] urgent care facility.  She presented there on December 02, 2020 with abdominal pain, nausea, and vomiting.  She gave a history of known biliary dyskinesia to the urgent care provider.  No labs or imaging were performed during that visit, but she was referred to general surgery for further evaluation and management.  Between the time of that visit and today, she apparently sought consultation at [facility], where she reports she had a HIDA scan in 2009.  Upon review of the electronic medical record, I am unable to see this study result, but I am able to see that between her urgent care visit and today, she was seen at [facility] by gastroenterology.  They postulated that her symptoms could potentially be gallbladder related versus duodenitis or peptic ulcer disease.  They ordered a right upper quadrant ultrasound, but this has not yet been completed.  They also prescribed Prilosec.  Belinda Hawkins indicates that she has had issues with abdominal pain for roughly 10 years.  She reports that around [place] of this year, she got very sick and this lasted about 3 days.  She describes nausea and vomiting, as well as a low-grade fever.  She had diarrhea in the day or so prior to the onset of abdominal pain.  She states that the pain was brought on by not eating and resolved when she took Prilosec.  She describes the pain as burning and seems to be localized to the right side of her body, just lateral to the epigastrium.  She also describes symptoms of bloating, abdominal cramping, and despite the earlier diarrhea, she is now constipated.  She had a previous right upper quadrant ultrasound in 2012 that did not demonstrate any  cholelithiasis, biliary ductal dilatation, or any gallbladder wall thickening/concern for cholecystitis.  She states that because of her illness, she has not been working much.  She says that she would prefer not to undergo surgery, if at all possible.   Past Medical History:  Diagnosis Date  . Biliary dyskinesia   . Fibromyalgia   . Lyme disease     Past Surgical History:  Procedure Laterality Date  . CESAREAN SECTION    . ECTOPIC PREGNANCY SURGERY    . OVARIAN CYST SURGERY      Family History  Problem Relation Age of Onset  . Healthy Mother   . Melanoma Father     Social History Social History   Tobacco Use  . Smoking status: Never Smoker  . Smokeless tobacco: Never Used  Vaping Use  . Vaping Use: Never used  Substance Use Topics  . Alcohol use: No  . Drug use: No    Allergies  Allergen Reactions  . Nsaids Nausea Only    Current Outpatient Medications  Medication Sig Dispense Refill  . buprenorphine (BUTRANS) 20 MCG/HR PTWK Place 20 mcg onto the skin once a week.    . ondansetron (ZOFRAN ODT) 4 MG disintegrating tablet Take 1 tablet (4 mg total) by mouth every 8 (eight) hours as needed for nausea or vomiting. 20 tablet 0  . oxyCODONE (OXYCONTIN) 15 mg 12 hr tablet Take 15 mg by mouth  as needed.    Marland Kitchen TIZANIDINE HCL PO Take by mouth daily.     No current facility-administered medications for this visit.    Review of Systems Review of Systems  Gastrointestinal: Positive for abdominal pain, constipation, diarrhea, nausea and vomiting.       Heartburn  All other systems reviewed and are negative. Or as discussed in the history of present illness.  Blood pressure 123/85, pulse 88, temperature (!) 97.5 F (36.4 C), temperature source Oral, height 5\' 2"  (1.575 m), weight 142 lb (64.4 kg), SpO2 96 %. Body mass index is 25.97 kg/m.  Physical Exam Physical Exam Vitals reviewed.  Constitutional:      General: She is not in acute distress.    Appearance:  Normal appearance. She is normal weight.  HENT:     Head: Normocephalic and atraumatic.     Nose:     Comments: Covered with a mask    Mouth/Throat:     Comments: Covered with a mask Eyes:     General: No scleral icterus.       Right eye: No discharge.        Left eye: No discharge.  Cardiovascular:     Rate and Rhythm: Normal rate and regular rhythm.  Pulmonary:     Effort: Pulmonary effort is normal.     Breath sounds: Normal breath sounds.  Abdominal:     General: Bowel sounds are normal. There is no distension.     Palpations: Abdomen is soft.     Tenderness: There is no abdominal tenderness. There is no guarding.     Comments: sign is negative.  She reports a burning or gnawing sensation just to the right of midline in the upper abdomen.  Not reproducible.  Genitourinary:    Comments: Deferred Musculoskeletal:        General: No swelling or deformity.     Cervical back: Normal range of motion.  Skin:    General: Skin is warm and dry.  Neurological:     General: No focal deficit present.     Mental Status: She is alert and oriented to person, place, and time.  Psychiatric:        Mood and Affect: Mood normal.        Behavior: Behavior normal.     Data Reviewed As discussed in the history of present illness, I reviewed the electronic medical record with the findings as indicated  Assessment This is a 60 year old woman who has had longstanding abdominal pain.  She does give report of prior diagnosis of biliary dyskinesia, but I do not have any evidence to support this.  Her symptoms are not classic for biliary dyskinesia or other gallbladder etiology, she has some symptoms that sound as though they Belinda be related to IBS.  She also has other symptoms consistent with peptic ulcer disease, gastritis, or duodenitis.  Plan We will start with a right upper quadrant ultrasound to better assess her gallbladder.  I anticipate that the findings will be similar to those in  2012.  We have also placed a referral to gastroenterology to be evaluated for other potential causes of her abdominal symptoms.  If the ultrasound is negative, we Belinda proceed with a HIDA scan.  This Belinda or Belinda not result in a recommendation for cholecystectomy, but we will await additional information before determining our course of action.  Follow-up to be determined.  I spent greater than 50% of this 45-minute visit in counseling and coordination  of patient care.   Duanne Guess 01/12/2021, 2:28 PM

## 2021-01-16 ENCOUNTER — Encounter: Payer: Self-pay | Admitting: *Deleted

## 2021-01-17 ENCOUNTER — Ambulatory Visit: Admission: RE | Admit: 2021-01-17 | Payer: 59 | Source: Ambulatory Visit

## 2021-01-27 ENCOUNTER — Other Ambulatory Visit: Payer: Self-pay

## 2021-01-27 ENCOUNTER — Telehealth: Payer: Self-pay | Admitting: Internal Medicine

## 2021-01-27 ENCOUNTER — Ambulatory Visit
Admission: EM | Admit: 2021-01-27 | Discharge: 2021-01-27 | Disposition: A | Discharge status: Home / Self Care | Payer: 59 | Attending: Internal Medicine | Admitting: Internal Medicine

## 2021-01-27 DIAGNOSIS — N3001 Acute cystitis with hematuria: Secondary | ICD-10-CM | POA: Diagnosis present

## 2021-01-27 LAB — URINALYSIS, COMPLETE (UACMP) WITH MICROSCOPIC
Bilirubin Urine: NEGATIVE
Glucose, UA: NEGATIVE mg/dL
Ketones, ur: NEGATIVE mg/dL
Nitrite: POSITIVE — AB
Protein, ur: 30 mg/dL — AB
Specific Gravity, Urine: 1.02 (ref 1.005–1.030)
WBC, UA: >50 WBC/hpf (ref 0–5)
pH: 7 (ref 5.0–8.0)

## 2021-01-27 MED ORDER — NITROFURANTOIN MONOHYD MACRO 100 MG PO CAPS: 100.0000 mg | ORAL_CAPSULE | Freq: Two times a day (BID) | ORAL | 0 refills | Status: DC

## 2021-01-27 MED ORDER — ONDANSETRON 4 MG PO TBDP: 4.0000 mg | ORAL_TABLET | Freq: Three times a day (TID) | ORAL | 0 refills | Status: DC | PRN

## 2021-01-27 MED ORDER — ALOE 10000 & PROBIOTICS PO CAPS: 1.0000 | ORAL_CAPSULE | Freq: Three times a day (TID) | ORAL | 0 refills | Status: AC

## 2021-01-27 MED ORDER — ALOE 10000 & PROBIOTICS PO CAPS: ORAL_CAPSULE | ORAL | 0 refills | Status: DC

## 2021-01-27 NOTE — ED Provider Notes (Signed)
[facility]    CSN: [id number] Arrival date & time: 01/27/21  1506      History   Chief Complaint Chief Complaint  Patient presents with  . Dysuria    HPI Belinda Hawkins is a 60 y.o. female who presents with onset of urinary frequency, urgency, cloudy urine x 3 days. She denies flank pain, hematuria, fever, N/V/D. Has been taking cranberry pills.     Past Medical History:  Diagnosis Date  . Biliary dyskinesia   . Fibromyalgia   . Lyme disease     There are no problems to display for this patient.   Past Surgical History:  Procedure Laterality Date  . CESAREAN SECTION    . ECTOPIC PREGNANCY SURGERY    . OVARIAN CYST SURGERY      OB History   No obstetric history on file.      Home Medications    Prior to Admission medications   Medication Sig Start Date End Date Taking? Authorizing Provider  buprenorphine (BUTRANS) 20 MCG/HR PTWK Place 20 mcg onto the skin once a week.   Yes [provider]  ondansetron (ZOFRAN ODT) 4 MG disintegrating tablet Take 1 tablet (4 mg total) by mouth every 8 (eight) hours as needed for nausea or vomiting. 12/02/20  Yes Everlene Other G, DO  oxyCODONE (OXYCONTIN) 15 mg 12 hr tablet Take 15 mg by mouth as needed.   Yes [provider]  traZODone (DESYREL) 50 MG tablet TAKE 1/2 TO 1 TABLET BY MOUTH EVERY NIGHT AT BEDTIME AS NEEDED FOR INSOMNIA 01/11/21  Yes [provider]  TIZANIDINE HCL PO Take by mouth daily.    [provider]    Family History Family History  Problem Relation Age of Onset  . Healthy Mother   . Melanoma Father     Social History Social History   Tobacco Use  . Smoking status: Never Smoker  . Smokeless tobacco: Never Used  Vaping Use  . Vaping Use: Never used  Substance Use Topics  . Alcohol use: No  . Drug use: No     Allergies   Nsaids and Lorazepam   Review of Systems Review of Systems  Constitutional: Negative for chills, diaphoresis and fever.   Genitourinary: Positive for dysuria and frequency. Negative for flank pain.       + cloudy urine  Musculoskeletal: Negative for gait problem.  Skin: Negative for rash.     Physical Exam Triage Vital Signs ED Triage Vitals [01/27/21 1531]  Enc Vitals Group     BP      Pulse      Resp      Temp      Temp src      SpO2      Weight      Height      Head Circumference      Peak Flow      Pain Score 4     Pain Loc      Pain Edu?      Excl. in GC?    No data found.  Updated Vital Signs There were no vitals taken for this visit.  Visual Acuity Right Eye Distance:   Left Eye Distance:   Bilateral Distance:    Right Eye Near:   Left Eye Near:    Bilateral Near:     Physical Exam Physical Exam Vitals and nursing note reviewed.  Constitutional:      General: She is not in acute  distress.    Appearance: She is not toxic-appearing.  HENT:     Head: Normocephalic.     Right Ear: External ear normal.     Left Ear: External ear normal.  Eyes:     General: No scleral icterus.    Conjunctiva/sclera: Conjunctivae normal.  Pulmonary:     Effort: Pulmonary effort is normal.  Abdominal:     General: Bowel sounds are normal.     Palpations: Abdomen is soft. There is no mass.     Tenderness: There is no guarding or rebound.     Comments: - CVA tenderness   Musculoskeletal:        General: Normal range of motion.     Cervical back: Neck supple.   Skin:    General: Skin is warm and dry.     Findings: No rash.  Neurological:     Mental Status: She is alert and oriented to person, place, and time.     Gait: Gait normal.  Psychiatric:        Mood and Affect: Mood normal.        Behavior: Behavior normal.        Thought Content: Thought content normal.        Judgment: Judgment normal.     UC Treatments / Results  Labs (all labs ordered are listed, but only abnormal results are displayed) Labs Reviewed  URINALYSIS, COMPLETE (UACMP) WITH MICROSCOPIC     EKG   Radiology No results found.  Procedures Procedures (including critical care time)  Medications Ordered in UC Medications - No data to display  Initial Impression / Assessment and Plan / UC Course  I have reviewed the triage vital signs and the nursing notes. Pertinent labs  results that were available during my care of the patient were reviewed by me and considered in my medical decision making (see chart for details). She requested for Rx of probiotics and I sent one as well as Macrobid  Her urine was sent out for a culture.  She does not have a Administrator, arts in town which she prefers, so I gave her information about the [facility] in [place].  Final Clinical Impressions(s) / UC Diagnoses   Final diagnoses:  None   Discharge Instructions   None    ED Prescriptions    None     PDMP not reviewed this encounter.   Garey Ham, PA-C 01/27/21 1715

## 2021-01-27 NOTE — ED Triage Notes (Signed)
Pt c/o burning with urination, frequency, urgency and cloudy urine for the past three days. Pt has recurrent RUQ and mid right abdominal pain that she is being evaluated for.   Denies fever, n/v/d, flank/back pain, hematuria.  Has been taking cranberry pills.  Pt is scheduled for u/s regarding GI complaints and states she is concerned about antibiotics causing more GI issues.

## 2021-01-27 NOTE — Telephone Encounter (Signed)
Pt said she told me she needed her zofran refilled, but I dont recall this, also her probiotic rx did not make it to the pharmacy. So I printed both and she was handed the rxs.

## 2021-01-27 NOTE — Discharge Instructions (Addendum)
We will call you if the urine culture shows we need to change your medication.   Check : [facility] [address] [place], [place] [address] [phone]

## 2021-01-29 ENCOUNTER — Encounter: Payer: Self-pay | Admitting: General Surgery

## 2021-01-30 ENCOUNTER — Telehealth: Payer: Self-pay

## 2021-01-30 ENCOUNTER — Encounter: Payer: Self-pay | Admitting: *Deleted

## 2021-01-30 NOTE — Telephone Encounter (Signed)
Spoke with patient to let her know the referral was placed to [employer] and phone number was provided as well.

## 2021-02-08 ENCOUNTER — Ambulatory Visit: Payer: 59

## 2021-02-15 ENCOUNTER — Ambulatory Visit (INDEPENDENT_AMBULATORY_CARE_PROVIDER_SITE_OTHER): Payer: 59 | Admitting: Gastroenterology

## 2021-02-15 ENCOUNTER — Other Ambulatory Visit: Payer: Self-pay

## 2021-02-15 ENCOUNTER — Encounter: Payer: Self-pay | Admitting: Gastroenterology

## 2021-02-15 VITALS — BP 133/76 | HR 88 | Temp 97.8°F | Ht 62.0 in | Wt 142.0 lb

## 2021-02-15 DIAGNOSIS — R109 Unspecified abdominal pain: Secondary | ICD-10-CM | POA: Diagnosis not present

## 2021-02-15 DIAGNOSIS — N950 Postmenopausal bleeding: Secondary | ICD-10-CM | POA: Insufficient documentation

## 2021-02-15 NOTE — Progress Notes (Signed)
Gastroenterology Consultation  Referring Provider:     Duanne Guess, MD Primary Care Physician:  Pcp, No Primary Gastroenterologist:  Dr. Servando Snare     Reason for Consultation:     Right upper quadrant pain        HPI:   Belinda Hawkins is a 60 y.o. y/o female referred for consultation & management of Right upper quadrant pain by Dr. Oneita Hurt, No.  This patient comes to see me after seeing a another gastroenterologist at [facility] 6 weeks ago with the recommendations of:  ASSESSMENT/PLAN: ICD-10-CM  1. RUQ pain R10.11  2. Screening for colon cancer Z12.11  3. Fibromyalgia M79.7  Right upper quadrant pain  Patient presented with right upper quadrant pain which may be consistent with biliary dyskinesia. Other possible etiologies include duodenitis, peptic ulcer disease. She also has a history of fibromyalgia. -We will start a trial of Prilosec 20 mg daily x2 weeks. -Right upper quadrant ultrasound ordered. If this is negative, would proceed with HIDA scan with [facility]. Screen for colon cancer -Patient does not wish to proceed at this time. She will consider whether or not she would like to proceed with colonoscopy and call us back.  The patient was then seen by surgery for possible biliary dyskinesia.  The patient's last contact with the other gastrologist office was proximal to 2 weeks ago for a refill medication request.  The patient had been in urgent care on January 22 for the right sided abdominal pain. The patient reports that the Prilosec made her feel better.  The patient has had these symptoms in the past but treated it with diet and states that her biliary dyskinesia went away for many years.  The patient now reports that she is doing well with some burning pain in the right lower quadrant that started right after she had a episode of extreme vomiting with retching.  Past Medical History:  Diagnosis Date  . Biliary dyskinesia   . Fibromyalgia   . Lyme disease     Past Surgical  History:  Procedure Laterality Date  . CESAREAN SECTION    . ECTOPIC PREGNANCY SURGERY    . OVARIAN CYST SURGERY      Prior to Admission medications   Medication Sig Start Date End Date Taking? Authorizing Provider  buprenorphine (BUTRANS) 20 MCG/HR PTWK Place 20 mcg onto the skin once a week.    [provider]  nitrofurantoin, macrocrystal-monohydrate, (MACROBID) 100 MG capsule Take 1 capsule (100 mg total) by mouth 2 (two) times daily. 01/27/21   Rodriguez-Southworth, Nettie Elm, PA-C  ondansetron (ZOFRAN ODT) 4 MG disintegrating tablet Take 1 tablet (4 mg total) by mouth every 8 (eight) hours as needed for nausea or vomiting. 12/02/20   Tommie Sams, DO  ondansetron (ZOFRAN ODT) 4 MG disintegrating tablet Take 1 tablet (4 mg total) by mouth every 8 (eight) hours as needed for nausea or vomiting. 01/27/21   Rodriguez-Southworth, Nettie Elm, PA-C  oxyCODONE (OXYCONTIN) 15 mg 12 hr tablet Take 15 mg by mouth as needed.    [provider]  Probiotic Product (ALOE 41962 & PROBIOTICS) CAPS One tid 01/27/21   Rodriguez-Southworth, Nettie Elm, PA-C  TIZANIDINE HCL PO Take by mouth daily.    [provider]  traZODone (DESYREL) 50 MG tablet TAKE 1/2 TO 1 TABLET BY MOUTH EVERY NIGHT AT BEDTIME AS NEEDED FOR INSOMNIA 01/11/21   [provider]    Family History  Problem Relation Age of Onset  . Healthy Mother   .  Melanoma Father      Social History   Tobacco Use  . Smoking status: Never Smoker  . Smokeless tobacco: Never Used  Vaping Use  . Vaping Use: Never used  Substance Use Topics  . Alcohol use: No  . Drug use: No    Allergies as of 02/15/2021 - Review Complete 01/27/2021  Allergen Reaction Noted  . Nsaids Nausea Only 01/30/2017  . Lorazepam  01/27/2021    Review of Systems:    All systems reviewed and negative except where noted in HPI.   Physical Exam:  There were no vitals taken for this visit. No LMP recorded. Patient is postmenopausal. General:    Alert,  Well-developed, well-nourished, pleasant and cooperative in NAD Head:  Normocephalic and atraumatic. Eyes:  Sclera clear, no icterus.   Conjunctiva pink. Ears:  Normal auditory acuity. Neck:  Supple; no masses or thyromegaly. Lungs:  Respirations even and unlabored.  Clear throughout to auscultation.   No wheezes, crackles, or rhonchi. No acute distress. Heart:  Regular rate and rhythm; no murmurs, clicks, rubs, or gallops. Abdomen:  Normal bowel sounds.  No bruits.  Soft, Positive tenderness to one finger palpation on the abdominal wall muscles on the right side and non-distended without masses, hepatosplenomegaly or hernias noted.  No guarding or rebound tenderness.  Positive Carnett sign.   Rectal:  Deferred.  Pulses:  Normal pulses noted. Extremities:  No clubbing or edema.  No cyanosis. Neurologic:  Alert and oriented x3;  grossly normal neurologically. Skin:  Intact without significant lesions or rashes.  No jaundice. Lymph Nodes:  No significant cervical adenopathy. Psych:  Alert and cooperative. Normal mood and affect.  Imaging Studies: No results found.  Assessment and Plan:   Belinda Hawkins is a 60 y.o. y/o female who comes in today with abdominal pain that is clearly musculoskeletal and started after she started vomiting.  The patient's right upper quadrant pain is more consistent with biliary dyskinesia and she is set up for an ultrasound for tomorrow with a subsequent HIDA scan if the ultrasound does not give [place] answers. The patient has been told to take the omeprazole for 8 weeks and then stop it.  She has early been on it for about 5 weeks.  The patient has again been told about the need for a screening colonoscopy and she states that she will think about it and contact us when she wants to have the procedure done.  The patient is now going to follow up with me since her previous gastrologist seen in February is far away and she like to transfer care to me.  The patient  has been explained the plan and agrees with it.    Midge Minium, MD. Clementeen Graham    Note: This dictation was prepared with Dragon dictation along with smaller phrase technology. Any transcriptional errors that result from this process are unintentional.

## 2021-02-16 ENCOUNTER — Ambulatory Visit (HOSPITAL_COMMUNITY)
Admission: RE | Admit: 2021-02-16 | Discharge: 2021-02-16 | Disposition: A | Discharge status: Home / Self Care | Payer: 59 | Source: Ambulatory Visit | Attending: General Surgery | Admitting: General Surgery

## 2021-02-16 DIAGNOSIS — K828 Other specified diseases of gallbladder: Secondary | ICD-10-CM | POA: Insufficient documentation

## 2021-02-28 ENCOUNTER — Telehealth: Payer: Self-pay

## 2021-02-28 NOTE — Telephone Encounter (Signed)
Patient would like a call concerning her ultrasound results.Marland Kitchen

## 2021-03-01 NOTE — Telephone Encounter (Signed)
Tried contacting pt back but voicemail was full. Unable to leave a message. Sent pt a mychart message to call me back.

## 2021-03-14 ENCOUNTER — Telehealth: Payer: Self-pay | Admitting: General Surgery

## 2021-03-14 NOTE — Telephone Encounter (Signed)
Was able to reach the patient and discussed the results of her right upper quadrant ultrasound.  It is completely normal.  She does want to proceed with the HIDA scan, but it will need to be delayed due to some out-of-town trips she has scheduled.  I let her know that I would have my office staff work on the scheduling and communicate with her regarding the timing.  She did indicate that those symptoms seem to have subsided for now and that she is otherwise doing well.

## 2021-03-15 ENCOUNTER — Telehealth: Payer: Self-pay

## 2021-03-15 NOTE — Telephone Encounter (Signed)
Spoke with patient at this time-she stated she would not be able to have the HIDA scan done until July-she will call our office to get this scheduled at her convenience.

## 2021-05-02 ENCOUNTER — Other Ambulatory Visit: Payer: Self-pay | Admitting: Gastroenterology

## 2021-05-02 MED ORDER — ONDANSETRON HCL 4 MG PO TABS: 4.0000 mg | ORAL_TABLET | Freq: Three times a day (TID) | ORAL | 0 refills | Status: DC | PRN

## 2021-05-02 NOTE — Telephone Encounter (Signed)
Last office visit 02/15/2021 right sided abdominal pain  Last refill 01/30/2021 historical medication

## 2021-05-02 NOTE — Telephone Encounter (Signed)
30 days  ondansetron (ZOFRAN) 4 MG tablet Medication Date: 02/15/2021 Department: [facility] [place] Documenting: Rayann Heman, CMA Authorizing: [provider]    Order Providers  Prescribing Provider Encounter Provider  [provider] Rayann Heman, CMA   Outpatient Medication Detail   Disp Refills Start End   ondansetron (ZOFRAN) 4 MG tablet   01/30/2021    Sig - Route: Take 4 mg by mouth every 8 (eight) hours as needed. - Oral   Class: Historical Med    Pharmacy  [facility] [phone] - [place], [place] - [address] [place] [address] [place] [address]   Additional Information  Associated Reports  View Encounter  Priority and Order Details

## 2021-08-21 ENCOUNTER — Encounter: Payer: Self-pay | Admitting: General Surgery

## 2022-01-30 ENCOUNTER — Ambulatory Visit
Admission: EM | Admit: 2022-01-30 | Discharge: 2022-01-30 | Disposition: A | Discharge status: Home / Self Care | Payer: BLUE CROSS/BLUE SHIELD | Attending: Family | Admitting: Family

## 2022-01-30 ENCOUNTER — Other Ambulatory Visit: Payer: Self-pay

## 2022-01-30 DIAGNOSIS — R52 Pain, unspecified: Secondary | ICD-10-CM | POA: Diagnosis not present

## 2022-01-30 DIAGNOSIS — R051 Acute cough: Secondary | ICD-10-CM | POA: Diagnosis not present

## 2022-01-30 MED ORDER — HYDROCOD POLI-CHLORPHE POLI ER 10-8 MG/5ML PO SUER: 5.0000 mL | Freq: Two times a day (BID) | ORAL | 0 refills | Status: DC | PRN

## 2022-01-30 NOTE — ED Triage Notes (Signed)
Pt c/o cough, body aches, sob x1week. Pt would like something given for her cough.  ? ?Pt took a home covid test and it was negative. ?

## 2022-01-30 NOTE — Discharge Instructions (Addendum)
Recommend continue to push fluids to help loosen up mucus in chest and sinuses. May take Tussionex cough syrup 1 teaspoon every 12 hours as needed- use mainly at night. Follow-up in 4 to 5 days if not resolving.  ?

## 2022-01-30 NOTE — ED Provider Notes (Signed)
[facility]    CSN: [id number] Arrival date & time: 01/30/22  1554      History   Chief Complaint Chief Complaint  Patient presents with   Cough    HPI Belinda Hawkins is a 61 y.o. female.   61 year old female presents with cough, chest congestion, fatigue, body aches and shortness of breath for over 1 week.  Started with a fever, cough and congestion.  Fever has resolved.  Minimal nasal congestion and denies any GI symptoms. Feels like"'I have been run over by a truck".  Thinks she may have had the flu.  Most symptoms are improving but unable to sleep due to the cough.  Requesting Rx-strength cough medication. Unable to take most OTC cough medications due to side effects/reactions. Took home COVID test which was negative.  No other family members ill.  Has history of fibromyalgia and insomnia and GI bleed.  Currently on Buprenorphine patch weekly, oxycodone and Trazodone daily and Zanaflex and Prilosec prn.   The history is provided by the patient.   Past Medical History:  Diagnosis Date   Biliary dyskinesia    Fibromyalgia    Lyme disease     Patient Active Problem List   Diagnosis Date Noted   PMB (postmenopausal bleeding) 02/15/2021   Myofascial pain 07/18/2016   Neck pain 07/18/2016    Past Surgical History:  Procedure Laterality Date   CESAREAN SECTION     ECTOPIC PREGNANCY SURGERY     OVARIAN CYST SURGERY      OB History   No obstetric history on file.      Home Medications    Prior to Admission medications   Medication Sig Start Date End Date Taking? Authorizing Provider  buprenorphine (BUTRANS) 15 MCG/HR APPLY 1 PATCH BY TRANSDERMAL ROUTE EVERY WEEK 01/29/21  Yes [provider]  chlorpheniramine-HYDROcodone (TUSSIONEX PENNKINETIC ER) 10-8 MG/5ML Take 5 mLs by mouth every 12 (twelve) hours as needed for cough. 01/30/22  Yes Sudie Grumbling, NP  omeprazole (PRILOSEC) 20 MG capsule  01/28/21  Yes [provider]  oxyCODONE  (OXYCONTIN) 15 mg 12 hr tablet Take 15 mg by mouth as needed.   Yes [provider]  tiZANidine (ZANAFLEX) 2 MG tablet Take 2 mg by mouth 3 (three) times daily. 02/13/21  Yes [provider]  traZODone (DESYREL) 50 MG tablet TAKE 1/2 TO 1 TABLET BY MOUTH EVERY NIGHT AT BEDTIME AS NEEDED FOR INSOMNIA 01/11/21  Yes [provider]    Family History Family History  Problem Relation Age of Onset   Healthy Mother    Melanoma Father     Social History Social History   Tobacco Use   Smoking status: Never   Smokeless tobacco: Never  Vaping Use   Vaping Use: Never used  Substance Use Topics   Alcohol use: No   Drug use: No     Allergies   Nsaids, Tolmetin, Acetaminophen, Eszopiclone, Ibuprofen, Lorazepam, and Metoclopramide   Review of Systems Review of Systems  Constitutional:  Positive for fatigue and fever (at first, but has resolved). Negative for appetite change, chills and diaphoresis.  HENT:  Positive for congestion (mostly chest) and postnasal drip. Negative for ear discharge, ear pain, mouth sores, nosebleeds, rhinorrhea, sinus pressure, sinus pain, sore throat and trouble swallowing.   Eyes:  Negative for pain, discharge, redness and itching.  Respiratory:  Positive for cough, chest tightness and shortness of breath.   Gastrointestinal:  Negative for diarrhea, nausea and  vomiting.  Musculoskeletal:  Positive for arthralgias, myalgias and neck pain.  Skin:  Negative for color change and rash.  Allergic/Immunologic: Negative for environmental allergies and food allergies.  Neurological:  Positive for headaches. Negative for dizziness, tremors, seizures, syncope, speech difficulty and numbness.  Hematological:  Negative for adenopathy. Does not bruise/bleed easily.  Psychiatric/Behavioral:  Positive for sleep disturbance.     Physical Exam Triage Vital Signs ED Triage Vitals  Enc Vitals Group     BP 01/30/22 1606 113/72     Pulse Rate 01/30/22  1606 83     Resp 01/30/22 1606 18     Temp 01/30/22 1606 97.8 F (36.6 C)     Temp Source 01/30/22 1606 Oral     SpO2 01/30/22 1606 95 %     Weight 01/30/22 1605 137 lb (62.1 kg)     Height 01/30/22 1605 5\' 2"  (1.575 m)     Head Circumference --      Peak Flow --      Pain Score 01/30/22 1604 4     Pain Loc --      Pain Edu? --      Excl. in GC? --    No data found.  Updated Vital Signs BP 113/72 (BP Location: Left Arm)   Pulse 83   Temp 97.8 F (36.6 C) (Oral)   Resp 18   Ht 5\' 2"  (1.575 m)   Wt 137 lb (62.1 kg)   SpO2 95%   BMI 25.06 kg/m   Visual Acuity Right Eye Distance:   Left Eye Distance:   Bilateral Distance:    Right Eye Near:   Left Eye Near:    Bilateral Near:     Physical Exam Vitals and nursing note reviewed.  Constitutional:      General: She is awake. She is not in acute distress.    Appearance: She is well-developed and well-groomed. She is ill-appearing.     Comments: She is sitting on the exam table in no acute distress but appears tired.   HENT:     Head: Normocephalic and atraumatic.     Right Ear: Hearing, tympanic membrane, ear canal and external ear normal.     Left Ear: Hearing, tympanic membrane, ear canal and external ear normal.     Nose: Nose normal. No congestion.     Right Sinus: No maxillary sinus tenderness or frontal sinus tenderness.     Left Sinus: No maxillary sinus tenderness or frontal sinus tenderness.     Mouth/Throat:     Lips: Pink.     Mouth: Mucous membranes are moist.     Pharynx: Uvula midline. Posterior oropharyngeal erythema present. No pharyngeal swelling, oropharyngeal exudate or uvula swelling.  Eyes:     Extraocular Movements: Extraocular movements intact.     Conjunctiva/sclera: Conjunctivae normal.  Cardiovascular:     Rate and Rhythm: Normal rate and regular rhythm.     Heart sounds: Normal heart sounds. No murmur heard. Pulmonary:     Effort: Pulmonary effort is normal. No tachypnea, respiratory  distress or retractions.     Breath sounds: Normal air entry. No decreased air movement. Examination of the right-upper field reveals decreased breath sounds. Examination of the left-upper field reveals decreased breath sounds. Decreased breath sounds present. No wheezing, rhonchi or rales.     Comments: No coarse breath sounds but slightly decreased breath sounds in upper lobes, especially with coughing.  Musculoskeletal:     Cervical back: Normal range of motion  and neck supple.  Lymphadenopathy:     Cervical: No cervical adenopathy.  Skin:    General: Skin is warm and dry.     Capillary Refill: Capillary refill takes less than 2 seconds.     Findings: No rash.  Neurological:     General: No focal deficit present.     Mental Status: She is alert and oriented to person, place, and time.  Psychiatric:        Mood and Affect: Mood normal.        Behavior: Behavior normal. Behavior is cooperative.        Thought Content: Thought content normal.        Judgment: Judgment normal.     UC Treatments / Results  Labs (all labs ordered are listed, but only abnormal results are displayed) Labs Reviewed - No data to display  EKG   Radiology No results found.  Procedures Procedures (including critical care time)  Medications Ordered in UC Medications - No data to display  Initial Impression / Assessment and Plan / UC Course  I have reviewed the triage vital signs and the nursing notes.  Pertinent labs & imaging results that were available during my care of the patient were reviewed by me and considered in my medical decision making (see chart for details).     Reviewed with patient that she may have had Influenza or similar viral illness and has a lingering cough. Discussed that she is out of the treatment window time period for antivirals if she did have Influenza. Patient understands and desires symptomatic treatment. Encouraged to continue to push fluids to help loosen up mucus  in her chest and sinuses. Since she has had difficulty with other cough medications and has used Tussionex in the past with no reactions, may trial Tussionex 1 teaspoon every 12 hours as needed- use mainly at night. Discussed side effects and interaction with her other opioid medications and to use sparingly. Note written for work. Follow-up with her PCP in 4 to 5 days if not resolving.  Final Clinical Impressions(s) / UC Diagnoses   Final diagnoses:  Acute cough  Generalized body aches     Discharge Instructions      Recommend continue to push fluids to help loosen up mucus in chest and sinuses. May take Tussionex cough syrup 1 teaspoon every 12 hours as needed- use mainly at night. Follow-up in 4 to 5 days if not resolving.     ED Prescriptions     Medication Sig Dispense Auth. Provider   chlorpheniramine-HYDROcodone (TUSSIONEX PENNKINETIC ER) 10-8 MG/5ML Take 5 mLs by mouth every 12 (twelve) hours as needed for cough. 115 mL Sudie Grumbling, NP      PDMP was reviewed this encounter. Patient has active Rx for Oxycodone #90 which was filled early March 2023. She also has active Rx for Butrans patch which was also filled in early March 2023. Discussed briefly risks for additional opioid medication and recommend use Hydrocodone cough medication sparingly. I believe the benefits outweigh the risks for a controlled medication for cough for a few days.    Sudie Grumbling, NP 01/31/22 [phone]

## 2022-08-27 ENCOUNTER — Ambulatory Visit (INDEPENDENT_AMBULATORY_CARE_PROVIDER_SITE_OTHER): Payer: 59

## 2022-08-27 ENCOUNTER — Ambulatory Visit
Admission: EM | Admit: 2022-08-27 | Discharge: 2022-08-27 | Disposition: A | Discharge status: Home / Self Care | Payer: 59 | Attending: Emergency Medicine | Admitting: Emergency Medicine

## 2022-08-27 DIAGNOSIS — R059 Cough, unspecified: Secondary | ICD-10-CM

## 2022-08-27 DIAGNOSIS — J209 Acute bronchitis, unspecified: Secondary | ICD-10-CM | POA: Diagnosis not present

## 2022-08-27 MED ORDER — IPRATROPIUM BROMIDE 0.06 % NA SOLN: 2.0000 | Freq: Four times a day (QID) | NASAL | 0 refills | Status: AC

## 2022-08-27 MED ORDER — ALBUTEROL SULFATE HFA 108 (90 BASE) MCG/ACT IN AERS: 1.0000 | INHALATION_SPRAY | RESPIRATORY_TRACT | 0 refills | Status: AC | PRN

## 2022-08-27 MED ORDER — AEROCHAMBER MV MISC: 1 refills | Status: AC

## 2022-08-27 MED ORDER — PREDNISONE 20 MG PO TABS: 40.0000 mg | ORAL_TABLET | Freq: Every day | ORAL | 0 refills | Status: AC

## 2022-08-27 MED ORDER — PROMETHAZINE-DM 6.25-15 MG/5ML PO SYRP: 5.0000 mL | ORAL_SOLUTION | Freq: Four times a day (QID) | ORAL | 0 refills | Status: AC | PRN

## 2022-08-27 NOTE — Discharge Instructions (Addendum)
Your chest x-ray was negative for pneumonia.  I am going to try treating you without antibiotics at this time.  2 puffs from your albuterol inhaler every 4 hours for 2 days, then every 6 hours for 2 days, then as needed.  You can back off on the albuterol if you start to improve sooner.  Finish steroids, even if you feel better.  Atrovent nasal spray, saline nasal irrigation with a NeilMed sinus rinse and distilled water as often as you want, Mucinex for the nasal congestion and postnasal drip.  Do not use Afrin for more than 3 days.  Promethazine DM for cough.  If you are not getting better, or if you get worse, return here or follow-up with your PCP we can consider antibiotics at that time.

## 2022-08-27 NOTE — ED Triage Notes (Signed)
Pt states she started feeling last week, did test for covid Friday (negative result) productive cough, worse at night

## 2022-08-27 NOTE — ED Provider Notes (Signed)
HPI  SUBJECTIVE:  Belinda Hawkins is a 61 y.o. female who presents with 6 days of nasal congestion, postnasal drip, sinus pressure located around the bridge of her nose, upper chest tightness, shortness of breath with exertion, fatigue.  She reports a cough productive of "white chunks" last night.  She was unable to sleep at night secondary to the cough.  She reports a headache starting today. No fevers, body aches, facial swelling, upper dental pain, wheezing, shortness of breath at rest, chest soreness, pressure, heaviness.  She reports double sickening.  She had 2 negative home COVID tests 3 days ago.  No allergy or GERD symptoms.  No antibiotics in the past 3 months.  No antipyretic in the past 6 hours.  She has tried soup, fluids, honey tea and Afrin.  No alleviating factors.  Symptoms are worse with lying down.  She has a past medical history of fibromyalgia and is on OxyContin.  No history of pulmonary disease.  She is not a smoker.  PCP: In [place].    Past Medical History:  Diagnosis Date   Biliary dyskinesia    Fibromyalgia    Lyme disease     Past Surgical History:  Procedure Laterality Date   CESAREAN SECTION     ECTOPIC PREGNANCY SURGERY     OVARIAN CYST SURGERY      Family History  Problem Relation Age of Onset   Healthy Mother    Melanoma Father     Social History   Tobacco Use   Smoking status: Never   Smokeless tobacco: Never  Vaping Use   Vaping Use: Never used  Substance Use Topics   Alcohol use: No   Drug use: No    No current facility-administered medications for this encounter.  Current Outpatient Medications:    albuterol (VENTOLIN HFA) 108 (90 Base) MCG/ACT inhaler, Inhale 1-2 puffs into the lungs every 4 (four) hours as needed for wheezing or shortness of breath., Disp: 1 each, Rfl: 0   buprenorphine (BUTRANS) 15 MCG/HR, APPLY 1 PATCH BY TRANSDERMAL ROUTE EVERY WEEK, Disp: , Rfl:    ipratropium (ATROVENT) 0.06 % nasal spray, Place 2 sprays into  both nostrils 4 (four) times daily., Disp: 15 mL, Rfl: 0   omeprazole (PRILOSEC) 20 MG capsule, , Disp: , Rfl:    oxyCODONE (OXYCONTIN) 15 mg 12 hr tablet, Take 15 mg by mouth as needed., Disp: , Rfl:    predniSONE (DELTASONE) 20 MG tablet, Take 2 tablets (40 mg total) by mouth daily with breakfast for 5 days., Disp: 10 tablet, Rfl: 0   promethazine-dextromethorphan (PROMETHAZINE-DM) 6.25-15 MG/5ML syrup, Take 5 mLs by mouth 4 (four) times daily as needed for cough., Disp: 118 mL, Rfl: 0   Spacer/Aero-Holding Chambers (AEROCHAMBER MV) inhaler, Use as instructed, Disp: 1 each, Rfl: 1   tiZANidine (ZANAFLEX) 2 MG tablet, Take 2 mg by mouth 3 (three) times daily., Disp: , Rfl:    traZODone (DESYREL) 50 MG tablet, TAKE 1/2 TO 1 TABLET BY MOUTH EVERY NIGHT AT BEDTIME AS NEEDED FOR INSOMNIA, Disp: , Rfl:   Allergies  Allergen Reactions   Nsaids Nausea Only   Tolmetin Nausea Only   Acetaminophen    Eszopiclone    Ibuprofen    Lorazepam    Metoclopramide     (Reglan)     ROS  As noted in HPI.   Physical Exam  BP 133/87 (BP Location: Left Arm)   Pulse 99   Temp 98.1 F (36.7 C) (Oral)  Ht 5\' 2"  (1.575 m)   Wt 64.4 kg   SpO2 98%   BMI 25.97 kg/m   Constitutional: Well developed, well nourished, no acute distress Eyes:  EOMI, conjunctiva normal bilaterally HENT: Normocephalic, atraumatic,mucus membranes moist.  Erythematous, swollen turbinates.  Clear nasal congestion.  No maxillary, frontal sinus tenderness.  No postnasal drip.  Positive cobblestoning. Respiratory: Normal inspiratory effort, lungs clear bilaterally, good air movement.  No anterior, lateral chest wall tenderness Cardiovascular: Normal rate, regular rhythm, no murmurs rubs or gallops GI: nondistended skin: No rash, skin intact Musculoskeletal: no deformities Neurologic: Alert & oriented x 3, no focal neuro deficits Psychiatric: Speech and behavior appropriate   ED Course   Medications - No data to  display  Orders Placed This Encounter  Procedures   DG Chest 2 View    Standing Status:   Standing    Number of Occurrences:   1    Order Specific Question:   Reason for Exam (SYMPTOM  OR DIAGNOSIS REQUIRED)    Answer:   6 days cough, rule out pneumonia    No results found for this or any previous visit (from the past 24 hour(s)). DG Chest 2 View  Result Date: 08/27/2022 CLINICAL DATA:  Six day history of cough.  Question pneumonia. EXAM: CHEST - 2 VIEW COMPARISON:  None Available. FINDINGS: Heart size is normal. Mediastinal shadows are normal. The lungs are clear. No infiltrate, collapse or effusion. Mild scoliotic curvature of the spine convex to the right. IMPRESSION: No active cardiopulmonary disease. Mild scoliotic curvature convex to the right. Electronically Signed   By: 08/29/2022 M.D.   On: 08/27/2022 14:48    ED Clinical Impression  1. Acute bronchitis, unspecified organism      ED Assessment/Plan   {The patient has been seen in Urgent Care in the last 3 years. :1} [facility] narcotic database reviewed.  Patient has been getting narcotic prescriptions from the same provider over the past 2 years, last fill was 100 OxyContin on 10/5.  Checking chest x-ray to rule out pneumonia.  Reviewed imaging independently.  No pneumonia.  See radiology report for full details.  Presentation consistent with bronchitis.  X-rays negative for pneumonia.  Deferring antibiotic treatment today.  Patient is amenable to this plan.  She can return here if not better in 4-5 days or if she gets worse and we can consider antibiotics at that time.  Home with Atrovent, prednisone, regularly scheduled albuterol inhaler with a spacer for the next 4 days and then as needed, Promethazine DM, saline nasal irrigation, Mucinex.  Follow-up with PCP or here as needed.  Work note for 2 days.  Discussed  imaging, MDM, treatment plan, and plan for follow-up with patient. patient agrees with plan.   Meds  ordered this encounter  Medications   albuterol (VENTOLIN HFA) 108 (90 Base) MCG/ACT inhaler    Sig: Inhale 1-2 puffs into the lungs every 4 (four) hours as needed for wheezing or shortness of breath.    Dispense:  1 each    Refill:  0   predniSONE (DELTASONE) 20 MG tablet    Sig: Take 2 tablets (40 mg total) by mouth daily with breakfast for 5 days.    Dispense:  10 tablet    Refill:  0   promethazine-dextromethorphan (PROMETHAZINE-DM) 6.25-15 MG/5ML syrup    Sig: Take 5 mLs by mouth 4 (four) times daily as needed for cough.    Dispense:  118 mL    Refill:  0   ipratropium (ATROVENT) 0.06 % nasal spray    Sig: Place 2 sprays into both nostrils 4 (four) times daily.    Dispense:  15 mL    Refill:  0   Spacer/Aero-Holding Chambers (AEROCHAMBER MV) inhaler    Sig: Use as instructed    Dispense:  1 each    Refill:  1      *This clinic note was created using Dragon dictation software. Therefore, there may be occasional mistakes despite careful proofreading.  ?

## 2022-10-18 IMAGING — US US ABDOMEN LIMITED RUQ/ASCITES
1 series · 14 of 25 positions shown · non-contrast
Comparison: None.

CLINICAL DATA: 59-year-old female with biliary dyskinesia.

EXAM:
ULTRASOUND ABDOMEN LIMITED RIGHT UPPER QUADRANT

[Series 1: us abdomen limited ruq/ascites · 14 of 87 slices shown]
[im 1/87]
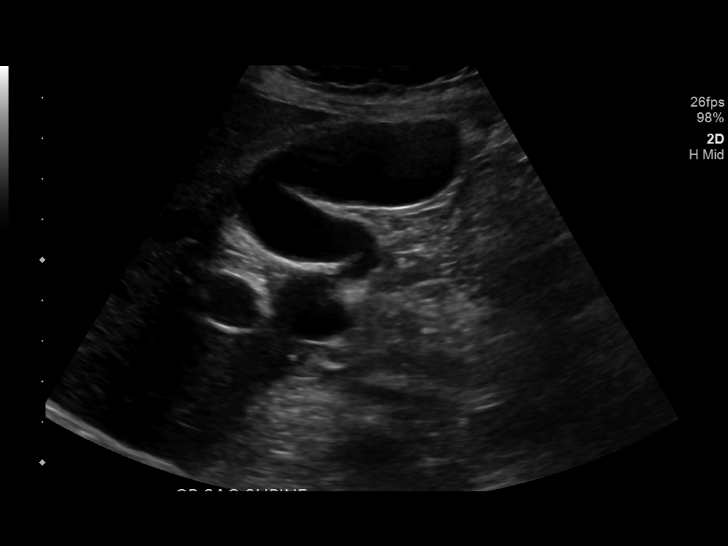
[im 8/87]
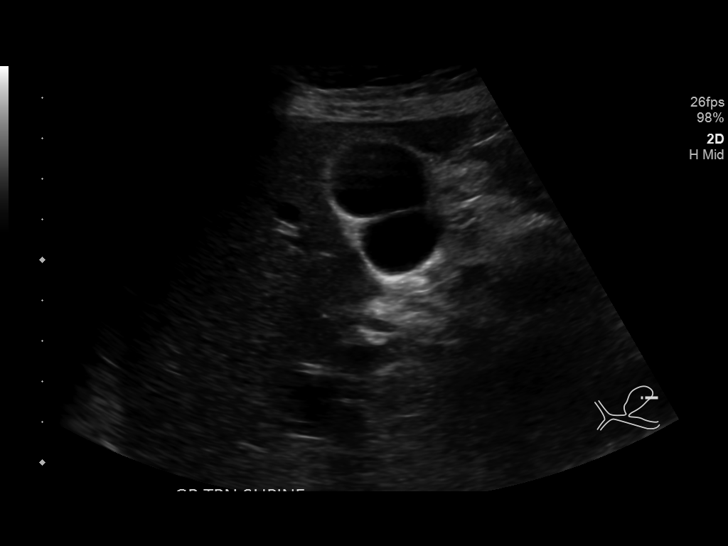
[im 15/87]
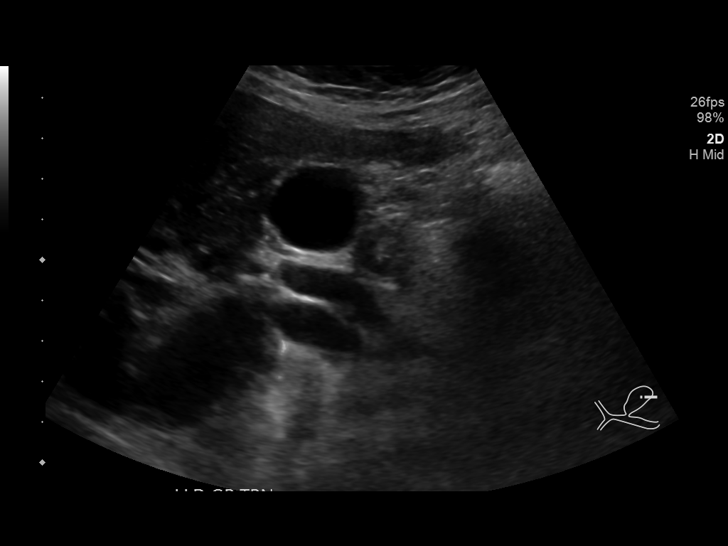
[im 22/87]
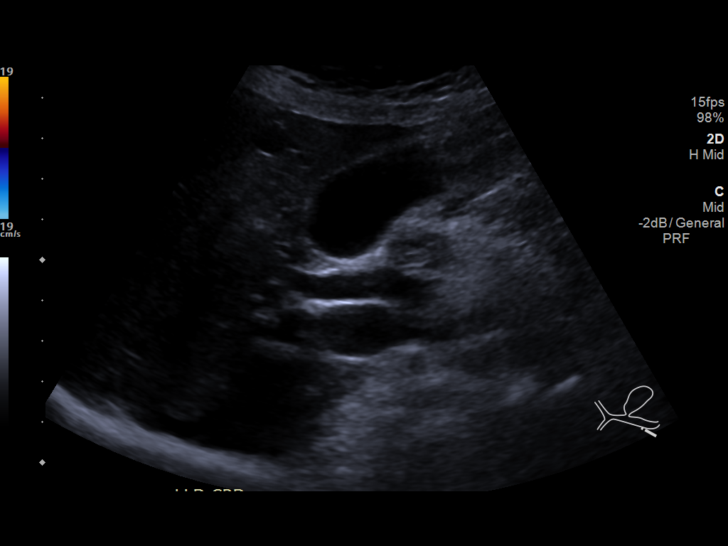
[im 29/87]
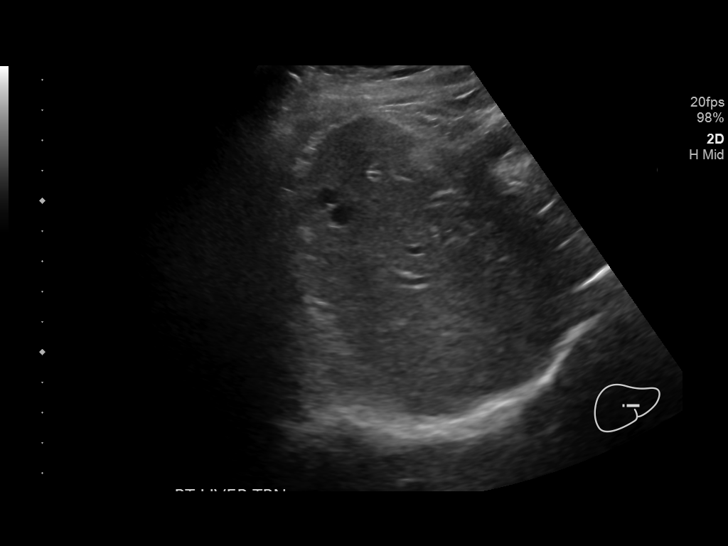
[im 33/87]
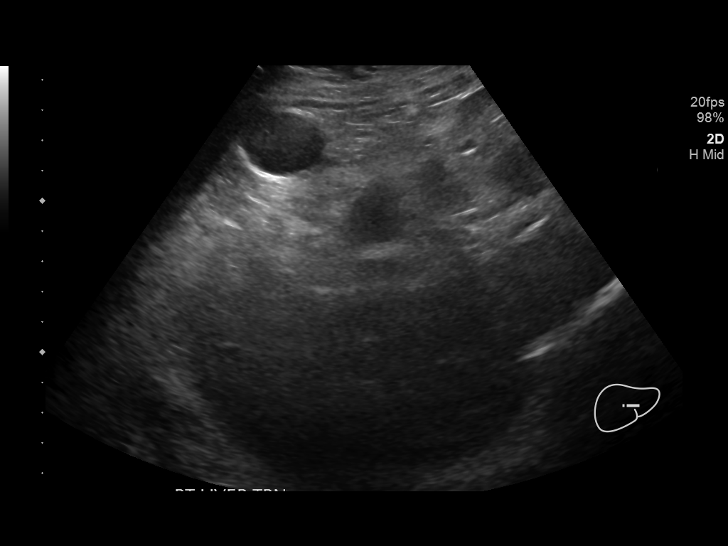
[im 40/87]
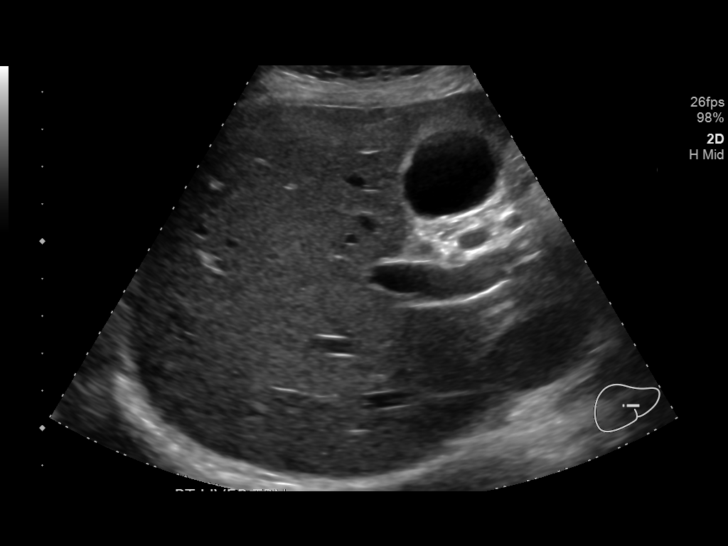
[im 47/87]
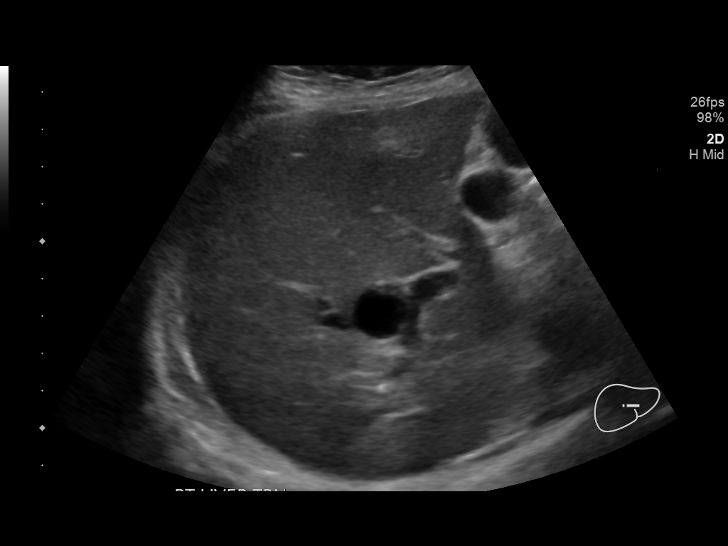
[im 54/87]
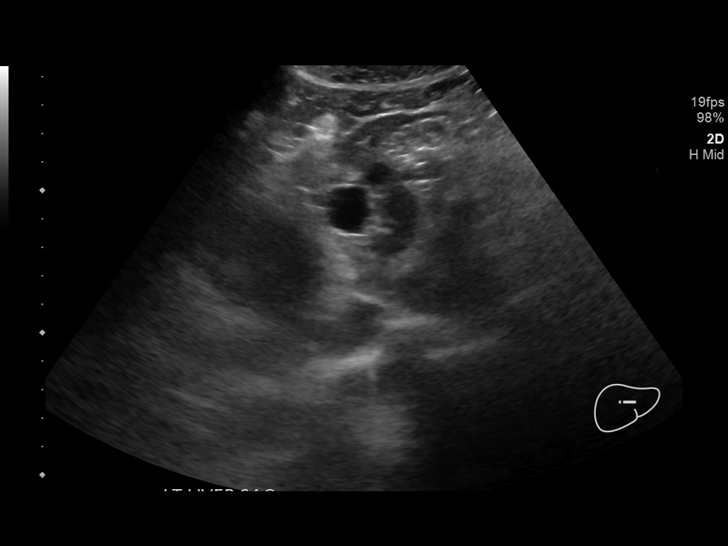
[im 58/87]
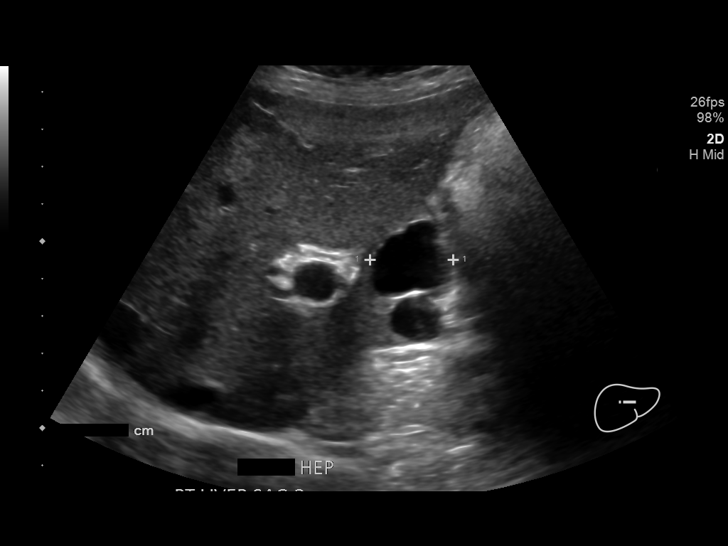
[im 65/87]
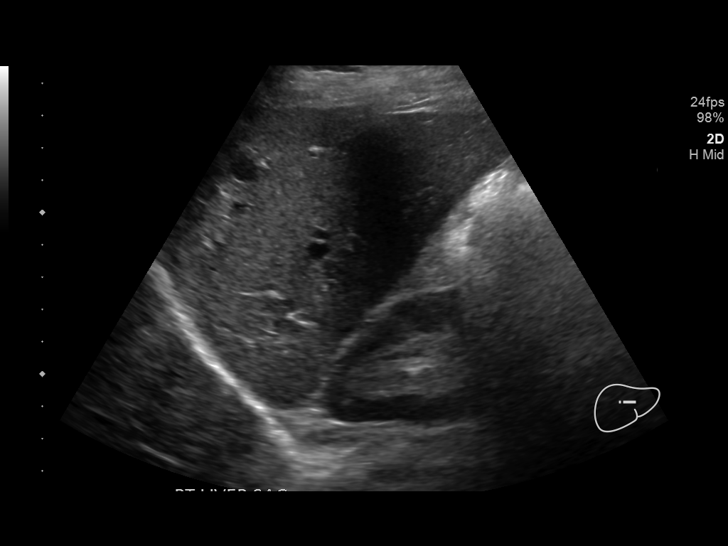
[im 72/87]
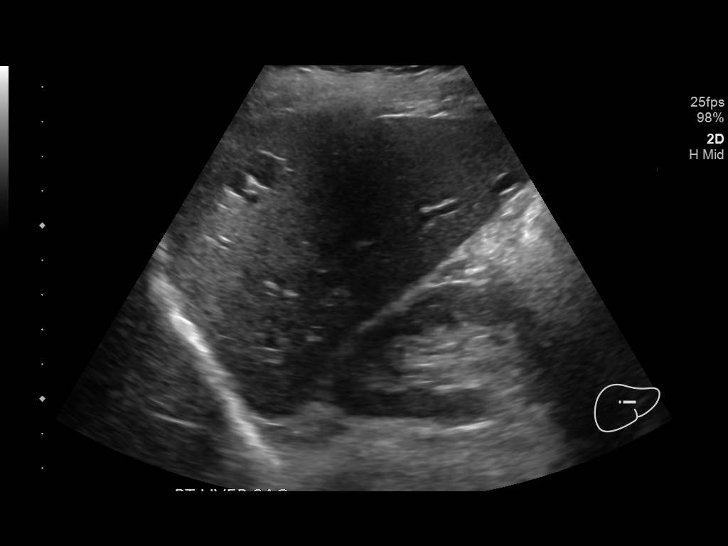
[im 79/87]
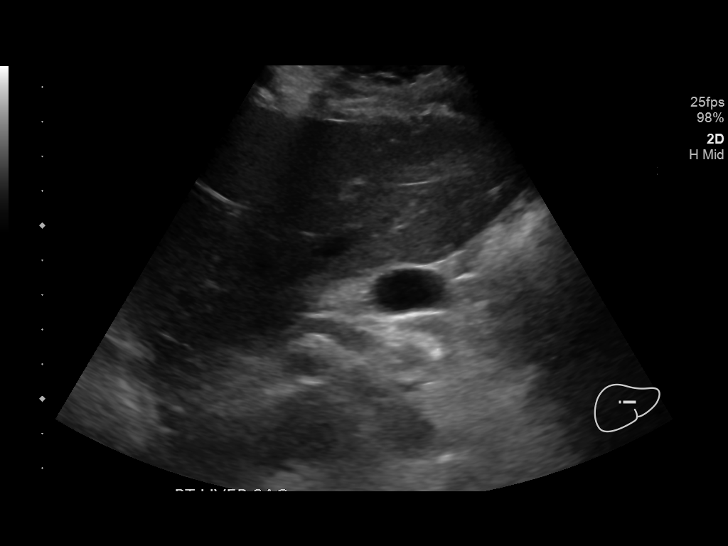
[im 87/87]
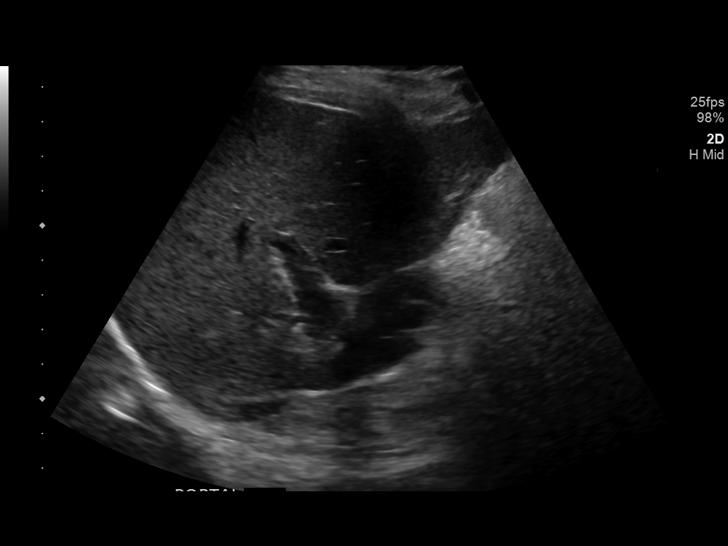

[14 of 25 positions shown; findings below may reference images not displayed]

FINDINGS: Gallbladder:

No gallstones or wall thickening visualized. No sonographic Murphy
sign noted by sonographer.

Common bile duct:

Diameter: 6 mm

Liver:

The liver demonstrates a normal echogenicity. Multiple liver cysts
measure up to 2.2 cm in the right lobe of the liver. Portal vein is
patent on color Doppler imaging with normal direction of blood flow
towards the liver.

Other: None.
IMPRESSION: Several small liver cysts, otherwise unremarkable right upper
quadrant ultrasound.

## 2023-01-02 ENCOUNTER — Ambulatory Visit
Admission: EM | Admit: 2023-01-02 | Discharge: 2023-01-02 | Disposition: A | Discharge status: Home / Self Care | Payer: 59 | Attending: Physician Assistant | Admitting: Physician Assistant

## 2023-01-02 DIAGNOSIS — N3000 Acute cystitis without hematuria: Secondary | ICD-10-CM | POA: Insufficient documentation

## 2023-01-02 DIAGNOSIS — R300 Dysuria: Secondary | ICD-10-CM | POA: Insufficient documentation

## 2023-01-02 LAB — URINALYSIS, W/ REFLEX TO CULTURE (INFECTION SUSPECTED)
Bilirubin Urine: NEGATIVE
Glucose, UA: NEGATIVE mg/dL
Ketones, ur: NEGATIVE mg/dL
Nitrite: NEGATIVE
Protein, ur: NEGATIVE mg/dL
Specific Gravity, Urine: 1.01 (ref 1.005–1.030)
WBC, UA: >50 WBC/hpf (ref 0–5)
pH: 5.5 (ref 5.0–8.0)

## 2023-01-02 MED ORDER — NITROFURANTOIN MONOHYD MACRO 100 MG PO CAPS: 100.0000 mg | ORAL_CAPSULE | Freq: Two times a day (BID) | ORAL | 0 refills | Status: AC

## 2023-01-02 NOTE — ED Provider Notes (Signed)
[facility]    CSN: [id number] Arrival date & time: 01/02/23  1510      History   Chief Complaint Chief Complaint  Patient presents with   Urinary Tract Infection    HPI Belinda Hawkins is a 62 y.o. female presenting for approximately 3 to 4-day history of dysuria, frequency and urgency as well as urinary cloudiness and bladder pressure.  She believes she has UTI.  She denies fever, flank pain, hematuria.  Has not taken any OTC meds for symptoms.  No other complaints or concerns.  HPI  Past Medical History:  Diagnosis Date   Biliary dyskinesia    Fibromyalgia    Lyme disease     Patient Active Problem List   Diagnosis Date Noted   PMB (postmenopausal bleeding) 02/15/2021   Myofascial pain 07/18/2016   Neck pain 07/18/2016    Past Surgical History:  Procedure Laterality Date   CESAREAN SECTION     ECTOPIC PREGNANCY SURGERY     OVARIAN CYST SURGERY      OB History   No obstetric history on file.      Home Medications    Prior to Admission medications   Medication Sig Start Date End Date Taking? Authorizing Provider  buprenorphine (BUTRANS) 15 MCG/HR APPLY 1 PATCH BY TRANSDERMAL ROUTE EVERY WEEK 01/29/21  Yes [provider]  nitrofurantoin, macrocrystal-monohydrate, (MACROBID) 100 MG capsule Take 1 capsule (100 mg total) by mouth 2 (two) times daily. 01/02/23  Yes Danton Clap, PA-C  oxyCODONE (OXYCONTIN) 15 mg 12 hr tablet Take 15 mg by mouth as needed.   Yes [provider]  promethazine-dextromethorphan (PROMETHAZINE-DM) 6.25-15 MG/5ML syrup Take 5 mLs by mouth 4 (four) times daily as needed for cough. 08/27/22  Yes Melynda Ripple, MD  traZODone (DESYREL) 50 MG tablet TAKE 1/2 TO 1 TABLET BY MOUTH EVERY NIGHT AT BEDTIME AS NEEDED FOR INSOMNIA 01/11/21  Yes [provider]  albuterol (VENTOLIN HFA) 108 (90 Base) MCG/ACT inhaler Inhale 1-2 puffs into the lungs every 4 (four) hours as needed for wheezing or shortness of  breath. 08/27/22   Melynda Ripple, MD  ipratropium (ATROVENT) 0.06 % nasal spray Place 2 sprays into both nostrils 4 (four) times daily. 08/27/22   Melynda Ripple, MD  omeprazole (PRILOSEC) 20 MG capsule  01/28/21   [provider]  Spacer/Aero-Holding Chambers (AEROCHAMBER MV) inhaler Use as instructed 08/27/22   Melynda Ripple, MD  tiZANidine (ZANAFLEX) 2 MG tablet Take 2 mg by mouth 3 (three) times daily. 02/13/21   [provider]    Family History Family History  Problem Relation Age of Onset   Healthy Mother    Melanoma Father     Social History Social History   Tobacco Use   Smoking status: Never   Smokeless tobacco: Never  Vaping Use   Vaping Use: Never used  Substance Use Topics   Alcohol use: No   Drug use: No     Allergies   Nsaids, Tolmetin, Acetaminophen, Eszopiclone, Ibuprofen, Lorazepam, and Metoclopramide   Review of Systems Review of Systems  Constitutional:  Positive for chills. Negative for fatigue and fever.  Gastrointestinal:  Negative for abdominal pain, diarrhea, nausea and vomiting.  Genitourinary:  Positive for dysuria, frequency and urgency. Negative for decreased urine volume, flank pain, hematuria, pelvic pain, vaginal bleeding, vaginal discharge and vaginal pain.  Musculoskeletal:  Negative for back pain.  Skin:  Negative for rash.     Physical Exam Triage Vital Signs ED Triage  Vitals  Enc Vitals Group     BP      Pulse      Resp      Temp      Temp src      SpO2      Weight      Height      Head Circumference      Peak Flow      Pain Score      Pain Loc      Pain Edu?      Excl. in [place]?    No data found.  Updated Vital Signs BP 117/72 (BP Location: Left Arm)   Pulse 77   Temp 98.1 F (36.7 C) (Oral)   Ht 5' 2"$  (1.575 m)   Wt 142 lb (64.4 kg)   SpO2 97%   BMI 25.97 kg/m      Physical Exam Vitals and nursing note reviewed.  Constitutional:      General: She is not in acute distress.     Appearance: Normal appearance. She is not ill-appearing or toxic-appearing.  HENT:     Head: Normocephalic and atraumatic.  Eyes:     General: No scleral icterus.       Right eye: No discharge.        Left eye: No discharge.     Conjunctiva/sclera: Conjunctivae normal.  Cardiovascular:     Rate and Rhythm: Normal rate and regular rhythm.     Heart sounds: Normal heart sounds.  Pulmonary:     Effort: Pulmonary effort is normal. No respiratory distress.     Breath sounds: Normal breath sounds.  Abdominal:     Palpations: Abdomen is soft.     Tenderness: There is abdominal tenderness (suprapubic). There is no right CVA tenderness or left CVA tenderness.  Musculoskeletal:     Cervical back: Neck supple.  Skin:    General: Skin is dry.  Neurological:     General: No focal deficit present.     Mental Status: She is alert. Mental status is at baseline.     Motor: No weakness.     Gait: Gait normal.  Psychiatric:        Mood and Affect: Mood normal.        Behavior: Behavior normal.        Thought Content: Thought content normal.      UC Treatments / Results  Labs (all labs ordered are listed, but only abnormal results are displayed) Labs Reviewed  URINALYSIS, W/ REFLEX TO CULTURE (INFECTION SUSPECTED) - Abnormal; Notable for the following components:      Result Value   APPearance HAZY (*)    Hgb urine dipstick TRACE (*)    Leukocytes,Ua SMALL (*)    Bacteria, UA MANY (*)    All other components within normal limits  URINE CULTURE    EKG   Radiology No results found.  Procedures Procedures (including critical care time)  Medications Ordered in UC Medications - No data to display  Initial Impression / Assessment and Plan / UC Course  I have reviewed the triage vital signs and the nursing notes.  Pertinent labs & imaging results that were available during my care of the patient were reviewed by me and considered in my medical decision making (see chart for  details).   62 year old female presents for dysuria, frequency and urgency x 3 to 4 days.  On exam she has mild suprapubic tenderness but no CVA tenderness.  She is afebrile.  Urinalysis shows trace hemoglobin, hazy urine, small excite to many bacteria.  Will send urine for culture but this is consistent with UTI.  Supportive care advised with increasing rest and fluids.  Sent Macrobid to pharmacy.  Will amend treatment based on culture result.  Reviewed return precautions.   Final Clinical Impressions(s) / UC Diagnoses   Final diagnoses:  Acute cystitis without hematuria  Dysuria     Discharge Instructions      UTI: Based on either symptoms or urinalysis, you may have a urinary tract infection. We will send the urine for culture and call with results in a few days. Begin antibiotics at this time. Your symptoms should be much improved over the next 2-3 days. Increase rest and fluid intake. If for some reason symptoms are worsening or not improving after a couple of days or the urine culture determines the antibiotics you are taking will not treat the infection, the antibiotics may be changed. Return or go to ER for fever, back pain, worsening urinary pain, discharge, increased blood in urine. May take Tylenol or Motrin OTC for pain relief or consider AZO if no contraindications      ED Prescriptions     Medication Sig Dispense Auth. Provider   nitrofurantoin, macrocrystal-monohydrate, (MACROBID) 100 MG capsule Take 1 capsule (100 mg total) by mouth 2 (two) times daily. 10 capsule Danton Clap, PA-C      PDMP not reviewed this encounter.   Danton Clap, PA-C 01/02/23 [phone]

## 2023-01-02 NOTE — Discharge Instructions (Signed)

## 2023-01-02 NOTE — ED Triage Notes (Signed)
Pt c/o urinary discomfort and cloudiness x3days  Pt states that she did fall and hurt her foot but is going to [facility]  Pt asks for prescription probiotics and a work note.

## 2023-01-03 LAB — URINE CULTURE: Culture: NO GROWTH
